# Patient Record
Sex: Female | Born: 1975 | Race: White | Hispanic: Yes | Marital: Married | Smoking: Never smoker
Health system: Southern US, Community
[De-identification: ages and names within clinical notes are randomized; demographics above are authoritative.]

## PROBLEM LIST (undated history)

## (undated) DIAGNOSIS — E282 Polycystic ovarian syndrome: Secondary | ICD-10-CM

## (undated) DIAGNOSIS — D573 Sickle-cell trait: Secondary | ICD-10-CM

## (undated) DIAGNOSIS — B977 Papillomavirus as the cause of diseases classified elsewhere: Secondary | ICD-10-CM

## (undated) HISTORY — DX: Papillomavirus as the cause of diseases classified elsewhere: B97.7

## (undated) HISTORY — DX: Polycystic ovarian syndrome: E28.2

## (undated) HISTORY — PX: HIP SURGERY: SHX245

---

## 1997-07-19 HISTORY — PX: CERVICAL BIOPSY  W/ LOOP ELECTRODE EXCISION: SUR135

## 2015-06-27 ENCOUNTER — Encounter: Payer: Self-pay | Admitting: Emergency Medicine

## 2015-06-27 ENCOUNTER — Ambulatory Visit
Admission: EM | Admit: 2015-06-27 | Discharge: 2015-06-27 | Disposition: A | Discharge status: Home / Self Care | Payer: BLUE CROSS/BLUE SHIELD | Attending: Family Medicine | Admitting: Family Medicine

## 2015-06-27 DIAGNOSIS — J069 Acute upper respiratory infection, unspecified: Secondary | ICD-10-CM | POA: Diagnosis not present

## 2015-06-27 DIAGNOSIS — M533 Sacrococcygeal disorders, not elsewhere classified: Secondary | ICD-10-CM

## 2015-06-27 HISTORY — DX: Sickle-cell trait: D57.3

## 2015-06-27 LAB — PREGNANCY, URINE: Preg Test, Ur: NEGATIVE

## 2015-06-27 LAB — RAPID STREP SCREEN (MED CTR MEBANE ONLY): Streptococcus, Group A Screen (Direct): NEGATIVE

## 2015-06-27 MED ORDER — FLUTICASONE PROPIONATE 50 MCG/ACT NA SUSP: 2.0000 | Freq: Every day | NASAL | Status: DC

## 2015-06-27 MED ORDER — MELOXICAM 15 MG PO TABS: 15.0000 mg | ORAL_TABLET | Freq: Every day | ORAL | Status: DC

## 2015-06-27 MED ORDER — METAXALONE 800 MG PO TABS
800.0000 mg | ORAL_TABLET | Freq: Three times a day (TID) | ORAL | Status: DC
Start: 2015-06-27 — End: 2016-03-18

## 2015-06-27 NOTE — ED Notes (Signed)
Patient c/o sore throat for a week.  Patient c/o lower back pain that started for couple of days.

## 2015-06-27 NOTE — Discharge Instructions (Signed)
Cool Mist Vaporizers Vaporizers may help relieve the symptoms of a cough and cold. They add moisture to the air, which helps mucus to become thinner and less sticky. This makes it easier to breathe and cough up secretions. Cool mist vaporizers do not cause serious burns like hot mist vaporizers, which may also be called steamers or humidifiers. Vaporizers have not been proven to help with colds. You should not use a vaporizer if you are allergic to mold. HOME CARE INSTRUCTIONS  Follow the package instructions for the vaporizer.  Do not use anything other than distilled water in the vaporizer.  Do not run the vaporizer all of the time. This can cause mold or bacteria to grow in the vaporizer.  Clean the vaporizer after each time it is used.  Clean and dry the vaporizer well before storing it.  Stop using the vaporizer if worsening respiratory symptoms develop.   This information is not intended to replace advice given to you by your health care provider. Make sure you discuss any questions you have with your health care provider.   Document Released: 04/01/2004 Document Revised: 07/10/2013 Document Reviewed: 11/22/2012 Elsevier Interactive Patient Education 2016 [employer]  Sacroiliac Joint Dysfunction Sacroiliac joint dysfunction is a condition that causes inflammation on one or both sides of the sacroiliac (SI) joint. The SI joint connects the lower part of the spine (sacrum) with the two upper portions of the pelvis (ilium). This condition causes deep aching or burning pain in the low back. In some cases, the pain may also spread into one or both buttocks or hips or spread down the legs. CAUSES This condition may be caused by:  Pregnancy. During pregnancy, extra stress is put on the SI joints because the pelvis widens.  Injury, such as:  Car accidents.  Sport-related injuries.  Work-related injuries.  Having one leg that is shorter than the other.  Conditions that affect  the joints, such as:  Rheumatoid arthritis.  Gout.  Psoriatic arthritis.  Joint infection (septic arthritis). Sometimes, the cause of SI joint dysfunction is not known. SYMPTOMS Symptoms of this condition include:  Aching or burning pain in the lower back. The pain may also spread to other areas, such as:  Buttocks.  Groin.  Thighs and legs.  Muscle spasms in or around the painful areas.  Increased pain when standing, walking, running, stair climbing, bending, or lifting. DIAGNOSIS Your health care provider will do a physical exam and take your medical history. During the exam, the health care provider may move one or both of your legs to different positions to check for pain. Various tests may be done to help verify the diagnosis, including:  Imaging tests to look for other causes of pain. These may include:  MRI.  CT scan.  Bone scan.  Diagnostic injection. A numbing medicine is injected into the SI joint using a needle. If the pain is temporarily improved or stopped after the injection, this can indicate that SI joint dysfunction is the problem. TREATMENT Treatment may vary depending on the cause and severity of your condition. Treatment options may include:  Applying ice or heat to the lower back area. This can help to reduce pain and muscle spasms.  Medicines to relieve pain or inflammation or to relax the muscles.  Wearing a back brace (sacroiliac brace) to help support the joint while your back is healing.  Physical therapy to increase muscle strength around the joint and flexibility at the joint. This may also involve learning  proper body positions and ways of moving to relieve stress on the joint.  Direct manipulation of the SI joint.  Injections of steroid medicine into the joint in order to reduce pain and swelling.  Radiofrequency ablation to burn away nerves that are carrying pain messages from the joint.  Use of a device that provides electrical  stimulation in order to reduce pain at the joint.  Surgery to put in screws and plates that limit or prevent joint motion. This is rare. HOME CARE INSTRUCTIONS  Rest as needed. Limit your activities as directed by your health care provider.  Take medicines only as directed by your health care provider.  If directed, apply ice to the affected area:  Put ice in a plastic bag.  Place a towel between your skin and the bag.  Leave the ice on for 20 minutes, 2-3 times per day.  Use a heating pad or a moist heat pack as directed by your health care provider.  Exercise as directed by your health care provider or physical therapist.  Keep all follow-up visits as directed by your health care provider. This is important. SEEK MEDICAL CARE IF:  Your pain is not controlled with medicine.  You have a fever.  You have increasingly severe pain. SEEK IMMEDIATE MEDICAL CARE IF:  You have weakness, numbness, or tingling in your legs or feet.  You lose control of your bladder or bowel.   This information is not intended to replace advice given to you by your health care provider. Make sure you discuss any questions you have with your health care provider.   Document Released: 10/01/2008 Document Revised: 11/19/2014 Document Reviewed: 03/12/2014 Elsevier Interactive Patient Education [employer]

## 2015-06-27 NOTE — ED Provider Notes (Signed)
CSN: [id number]     Arrival date & time 06/27/15  1215 History   First MD Initiated Contact with Patient 06/27/15 1357     Chief Complaint  Patient presents with  . Cough  . Back Pain   (Consider location/radiation/quality/duration/timing/severity/associated sxs/prior Treatment) HPI   This a 39 year old female who presents with complaints in  2 different areas.  First complaint is that of a sore throat for a week. States since she's had cold chills and a sore throat. She's felt feverish her temperature. She has had some sinus congestion along with a sore throat.  Second complaint is that of low back pain that started a couple days ago that is more right-sided and radiates into her right sacroiliac joint by indication. She's had recurrent low back pain that is usually signified by a bruise that she develops on her back. 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 Diarrhea remember any specific injury that occurred at work and has just noticed the pain over the last couple of days. Not radiate into her leg other than the sacroiliac joint level. She has no numbness or tingling. She's had no incontinence.  Past Medical History  Diagnosis Date  . Sickle cell trait [facility](HCC)    Past Surgical History  Procedure Laterality Date  . Hip surgery Right    History reviewed. No pertinent family history. Social History  Substance Use Topics  . Smoking status: Never Smoker   . Smokeless tobacco: None  . Alcohol Use: Yes   OB History    No data available     Review of Systems  Constitutional: Positive for fever, chills and activity change. Negative for fatigue.  HENT: Positive for congestion, postnasal drip and sore throat.   All other systems reviewed and are negative.   Allergies  Iodine  Home Medications   Prior to Admission medications   Medication Sig Start Date End Date Taking? Authorizing Provider   fluticasone (FLONASE) 50 MCG/ACT nasal spray Place 2 sprays into both nostrils daily. 06/27/15   Lutricia FeilWilliam P Bonnie Roig, PA-C  meloxicam (MOBIC) 15 MG tablet Take 1 tablet (15 mg total) by mouth daily. 06/27/15   Lutricia FeilWilliam P Jameire Kouba, PA-C  metaxalone (SKELAXIN) 800 MG tablet Take 1 tablet (800 mg total) by mouth 3 (three) times daily. 06/27/15   Lutricia FeilWilliam P Jamine Highfill, PA-C   Meds Ordered and Administered this Visit  Medications - No data to display  BP 135/80 mmHg  Pulse 92  Temp(Src) 97 F (36.1 C) (Tympanic)  Resp 16  Ht 4\' 11"  (1.499 m)  Wt 180 lb (81.647 kg)  BMI 36.34 kg/m2  SpO2 100%  LMP 05/29/2015 (Exact Date) No data found.   Physical Exam  Constitutional: She is oriented to person, place, and time. She appears well-developed and well-nourished. No distress.  HENT:  Head: Normocephalic and atraumatic.  Right Ear: External ear normal.  Left Ear: External ear normal.  Nose: Nose normal.  Mouth/Throat: Oropharynx is clear and moist. No oropharyngeal exudate.  Examination of the oropharynx shows the left tonsil to be slightly enlarged over the right. His mild erythema present. But no exudate is seen.  Eyes: Conjunctivae and EOM are normal. Pupils are equal, round, and reactive to light.  Neck: Normal range of motion. Neck supple.  Pulmonary/Chest: Breath sounds normal. No stridor. No respiratory distress. She has no wheezes. She has no rales.  Musculoskeletal: Normal range of motion. She  exhibits tenderness. She exhibits no edema.  Initial lumbar spine was carried out with the [facility] chaperone exams the lumbar spine shows a mild ecchymosis over the lower right spine. Forward flexion and bilateral lateral flexion is full and apparently comfortable. Is tenderness to palpation sharp and localized over the right sacral leg joint which is where the patient's symptoms are. Able to this toe and heel walk adequately. EHL peroneal and anterior tibialis tendons are drawn. Straight leg raise testing  is negative at 90 bilaterally in the sitting position.  Lymphadenopathy:    She has no cervical adenopathy.  Neurological: She is alert and oriented to person, place, and time.  Skin: Skin is warm and dry. No rash noted. She is not diaphoretic. No erythema.  Psychiatric: She has a normal mood and affect. Her behavior is normal. Judgment and thought content normal.  Nursing note and vitals reviewed.   ED Course  Procedures (including critical care time)  Labs Review Labs Reviewed  RAPID STREP SCREEN (NOT AT [facility])  CULTURE, GROUP A STREP ([facility] ONLY)  PREGNANCY, URINE    Imaging Review No results found.   Visual Acuity Review  Right Eye Distance:   Left Eye Distance:   Bilateral Distance:    Right Eye Near:   Left Eye Near:    Bilateral Near:         MDM   1. URI, acute   2. Pain of right sacroiliac joint    New Prescriptions   FLUTICASONE (FLONASE) 50 MCG/ACT NASAL SPRAY    Place 2 sprays into both nostrils daily.   MELOXICAM (MOBIC) 15 MG TABLET    Take 1 tablet (15 mg total) by mouth daily.   METAXALONE (SKELAXIN) 800 MG TABLET    Take 1 tablet (800 mg total) by mouth 3 (three) times daily.  Plan: 1. Test/x-ray results and diagnosis reviewed with patient 2. rx as per orders; risks, benefits, potential side effects reviewed with patient 3. Recommend supportive treatment with fluids and rest. Advised her to avoid sitting lifting and bending is much as feasible. Pregnancy test was negative today but she states that they are not trying to avoid pregnancy and therefore asked her to use the minimal amount of meloxicam until she feels somewhat better and then discontinue at use. 4. F/u prn if symptoms worsen or don't improve     Lutricia Feil, PA-C 06/27/15 1510

## 2015-06-30 LAB — CULTURE, GROUP A STREP (THRC)

## 2015-07-07 ENCOUNTER — Ambulatory Visit: Payer: Self-pay | Admitting: Family Medicine

## 2015-08-06 ENCOUNTER — Ambulatory Visit (INDEPENDENT_AMBULATORY_CARE_PROVIDER_SITE_OTHER): Payer: BLUE CROSS/BLUE SHIELD | Admitting: Internal Medicine

## 2015-08-06 ENCOUNTER — Encounter: Payer: Self-pay | Admitting: Internal Medicine

## 2015-08-06 VITALS — BP 98/62 | HR 72 | Ht 59.75 in | Wt 186.0 lb

## 2015-08-06 DIAGNOSIS — A630 Anogenital (venereal) warts: Secondary | ICD-10-CM

## 2015-08-06 DIAGNOSIS — D573 Sickle-cell trait: Secondary | ICD-10-CM | POA: Diagnosis not present

## 2015-08-06 DIAGNOSIS — M25559 Pain in unspecified hip: Secondary | ICD-10-CM

## 2015-08-06 DIAGNOSIS — N926 Irregular menstruation, unspecified: Secondary | ICD-10-CM

## 2015-08-06 DIAGNOSIS — B977 Papillomavirus as the cause of diseases classified elsewhere: Secondary | ICD-10-CM

## 2015-08-06 DIAGNOSIS — G8929 Other chronic pain: Secondary | ICD-10-CM | POA: Diagnosis not present
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
## 2015-08-07 ENCOUNTER — Telehealth: Payer: Self-pay

## 2015-08-07 LAB — HCG, SERUM, QUALITATIVE: HCG, BETA SUBUNIT, QUAL, SERUM: NEGATIVE m[IU]/mL (ref <6)

## 2015-08-07 NOTE — Telephone Encounter (Signed)
-----   Message from Reubin Milan, MD sent at 08/07/2015  7:59 AM EST ----- Negative pregnancy test.  Return for CPX and Pap - if persistent irregular menses, will refer for [place].

## 2015-08-07 NOTE — Telephone Encounter (Signed)
Left message for patient to call back  

## 2015-08-08 NOTE — Telephone Encounter (Signed)
Left message for patient to call back  

## 2015-08-11 NOTE — Telephone Encounter (Signed)
Spoke with patient. Patient advised of all results and verbalized understanding. Will call back with any future questions or concerns.   

## 2015-09-15 ENCOUNTER — Ambulatory Visit
Admission: EM | Admit: 2015-09-15 | Discharge: 2015-09-15 | Disposition: A | Discharge status: Home / Self Care | Payer: BLUE CROSS/BLUE SHIELD | Attending: Family Medicine | Admitting: Family Medicine

## 2015-09-15 ENCOUNTER — Encounter: Payer: Self-pay | Admitting: Emergency Medicine

## 2015-09-15 DIAGNOSIS — J069 Acute upper respiratory infection, unspecified: Secondary | ICD-10-CM

## 2015-09-15 LAB — RAPID INFLUENZA A&B ANTIGENS (ARMC ONLY)
INFLUENZA A (ARMC): NOT DETECTED — AB
INFLUENZA B (ARMC): NOT DETECTED — AB

## 2015-09-15 LAB — RAPID STREP SCREEN (MED CTR MEBANE ONLY): Streptococcus, Group A Screen (Direct): NEGATIVE

## 2015-09-15 MED ORDER — HYDROCOD POLST-CPM POLST ER 10-8 MG/5ML PO SUER: 5.0000 mL | Freq: Two times a day (BID) | ORAL | Status: DC

## 2015-09-15 NOTE — ED Provider Notes (Signed)
CSN: [id number]     Arrival date & time 09/15/15  1654 History   First MD Initiated Contact with Patient 09/15/15 1914     Chief Complaint  Patient presents with  . Fever  . Sore Throat  . Generalized Body Aches   (Consider location/radiation/quality/duration/timing/severity/associated sxs/prior Treatment) HPI   40 year old female who presents with body aches fever and a sore throat is started days ago. She's had a sick child that was examined here diagnosed with a viral illness and then now she has contracted. Her daughter had gotten the flu type symptoms from her cousin they play together all the time. She states that her right ear has been bothering her as well. She started coughing today which has been nonproductive so far. She did not receive a flu shot this year. Sates that her sore throat has been very sore as well.  Past Medical History  Diagnosis Date  . Sickle cell trait (HCC)   . PCOS (polycystic ovarian syndrome)   . HPV in female    Past Surgical History  Procedure Laterality Date  . Hip surgery Right     39mo old  . Cervical biopsy  w/ loop electrode excision  1999    abnormal Pap   Family History  Problem Relation Age of Onset  . Diabetes Father   . Sickle cell trait Father    Social History  Substance Use Topics  . Smoking status: Never Smoker   . Smokeless tobacco: None  . Alcohol Use: 0.0 oz/week    0 Standard drinks or equivalent per week     Comment: occasional   OB History    No data available     Review of Systems  Constitutional: Positive for fever, chills and fatigue. Negative for activity change.  HENT: Positive for congestion, postnasal drip, rhinorrhea, sinus pressure, sneezing and sore throat.   Respiratory: Positive for cough and shortness of breath. Negative for wheezing and stridor.   All other systems reviewed and are negative.   Allergies  Iodinated diagnostic agents and Iodine  Home Medications   Prior to Admission medications    Medication Sig Start Date End Date Taking? Authorizing Provider  chlorpheniramine-HYDROcodone (TUSSIONEX PENNKINETIC ER) 10-8 MG/5ML SUER Take 5 mLs by mouth 2 (two) times daily. 09/15/15   Lutricia Feil, PA-C  Cyanocobalamin (RA VITAMIN B-12 TR) 1000 MCG TBCR Take 1 tablet by mouth daily.    Historical Provider, MD  fluticasone (FLONASE) 50 MCG/ACT nasal spray Place 2 sprays into both nostrils daily. 06/27/15   Lutricia Feil, PA-C  Ginkgo Biloba 40 MG TABS Take 1 tablet by mouth daily.    Historical Provider, MD  meloxicam (MOBIC) 15 MG tablet Take 1 tablet (15 mg total) by mouth daily. 06/27/15   Lutricia Feil, PA-C  metaxalone (SKELAXIN) 800 MG tablet Take 1 tablet (800 mg total) by mouth 3 (three) times daily. 06/27/15   Lutricia Feil, PA-C  Multiple Vitamin (MULTI-VITAMINS) TABS Take 1 tablet by mouth daily.    Historical Provider, MD   Meds Ordered and Administered this Visit  Medications - No data to display  BP 112/72 mmHg  Pulse 102  Temp(Src) 99.7 F (37.6 C) (Tympanic)  Resp 16  Ht  (1.499 m)  Wt 182 lb (82.555 kg)  BMI 36.74 kg/m2  SpO2 100%  LMP 09/05/2015 (Exact Date) No data found.   Physical Exam  Constitutional: She is oriented to person, place, and time. She appears well-developed and well-nourished.  No distress.  HENT:  Head: Normocephalic and atraumatic.  Right Ear: External ear normal.  Left Ear: External ear normal.  Nose: Nose normal.  Mouth/Throat: Oropharynx is clear and moist. No oropharyngeal exudate.  Eyes: Conjunctivae are normal. Pupils are equal, round, and reactive to light. Right eye exhibits no discharge. Left eye exhibits no discharge.  Neck: Normal range of motion. Neck supple.  Pulmonary/Chest: Effort normal and breath sounds normal. No respiratory distress. She has no wheezes. She has no rales.  Musculoskeletal: Normal range of motion. She exhibits no edema or tenderness.  Neurological: She is alert and oriented to person,  place, and time.  Skin: Skin is warm and dry. She is not diaphoretic.  Psychiatric: She has a normal mood and affect. Her behavior is normal. Judgment and thought content normal.  Nursing note and vitals reviewed.   ED Course  Procedures (including critical care time)  Labs Review Labs Reviewed  RAPID INFLUENZA A&B ANTIGENS ([facility] ONLY) - Abnormal; Notable for the following:    Influenza A [facility]) NOT DETECTED (*)    Influenza B ([facility]) NOT DETECTED (*)    All other components within normal limits  RAPID STREP SCREEN (NOT AT [facility])  CULTURE, GROUP A STREP [facility])    Imaging Review No results found.   Visual Acuity Review  Right Eye Distance:   Left Eye Distance:   Bilateral Distance:    Right Eye Near:   Left Eye Near:    Bilateral Near:         MDM   1. Acute URI    New Prescriptions   CHLORPHENIRAMINE-HYDROCODONE (TUSSIONEX PENNKINETIC ER) 10-8 MG/5ML SUER    Take 5 mLs by mouth 2 (two) times daily.  Plan: 1. Test/x-ray results and diagnosis reviewed with patient 2. rx as per orders; risks, benefits, potential side effects reviewed with patient 3. Recommend supportive treatment with rest and fluids. Given her a note for work for 2 days leaving today. Follow-up with primary care she has any in increase in symptoms or is not improving 4. F/u prn if symptoms worsen or don't improve     Lutricia Feil, PA-C 09/15/15 1944

## 2015-09-15 NOTE — Discharge Instructions (Signed)
Cool Mist Vaporizers °Vaporizers may help relieve the symptoms of a cough and cold. They add moisture to the air, which helps mucus to become thinner and less sticky. This makes it easier to breathe and cough up secretions. Cool mist vaporizers do not cause serious burns like hot mist vaporizers, which may also be called steamers or humidifiers. Vaporizers have not been proven to help with colds. You should not use a vaporizer if you are allergic to mold. °HOME CARE INSTRUCTIONS °· Follow the package instructions for the vaporizer. °· Do not use anything other than distilled water in the vaporizer. °· Do not run the vaporizer all of the time. This can cause mold or bacteria to grow in the vaporizer. °· Clean the vaporizer after each time it is used. °· Clean and dry the vaporizer well before storing it. °· Stop using the vaporizer if worsening respiratory symptoms develop. °  °This information is not intended to replace advice given to you by your health care provider. Make sure you discuss any questions you have with your health care provider. °  °Document Released: 04/01/2004 Document Revised: 07/10/2013 Document Reviewed: 11/22/2012 °Elsevier Interactive Patient Education ©2016 Elsevier Inc[employer] ° °Upper Respiratory Infection, Adult °Most upper respiratory infections (URIs) are a viral infection of the air passages leading to the lungs. A URI affects the nose, throat, and upper air passages. The most common type of URI is nasopharyngitis and is typically referred to as "the common cold." °URIs run their course and usually go away on their own. Most of the time, a URI does not require medical attention, but sometimes a bacterial infection in the upper airways can follow a viral infection. This is called a secondary infection. Sinus and middle ear infections are common types of secondary upper respiratory infections. °Bacterial pneumonia can also complicate a URI. A URI can worsen asthma and chronic obstructive  pulmonary disease (COPD). Sometimes, these complications can require emergency medical care and may be life threatening.  °CAUSES °Almost all URIs are caused by viruses. A virus is a type of germ and can spread from one person to another.  °RISKS FACTORS °You may be at risk for a URI if:  °· You smoke.   °· You have chronic heart or lung disease. °· You have a weakened defense (immune) system.   °· You are very young or very old.   °· You have nasal allergies or asthma. °· You work in crowded or poorly ventilated areas. °· You work in health care facilities or schools. °SIGNS AND SYMPTOMS  °Symptoms typically develop 2-3 days after you come in contact with a cold virus. Most viral URIs last 7-10 days. However, viral URIs from the influenza virus (flu virus) can last 14-18 days and are typically more severe. Symptoms may include:  °· Runny or stuffy (congested) nose.   °· Sneezing.   °· Cough.   °· Sore throat.   °· Headache.   °· Fatigue.   °· Fever.   °· Loss of appetite.   °· Pain in your forehead, behind your eyes, and over your cheekbones (sinus pain). °· Muscle aches.   °DIAGNOSIS  °Your health care provider may diagnose a URI by: °· Physical exam. °· Tests to check that your symptoms are not due to another condition such as: °¨ Strep throat. °¨ Sinusitis. °¨ Pneumonia. °¨ Asthma. °TREATMENT  °A URI goes away on its own with time. It cannot be cured with medicines, but medicines may be prescribed or recommended to relieve symptoms. Medicines may help: °· Reduce your fever. °· Reduce   your cough. °· Relieve nasal congestion. °HOME CARE INSTRUCTIONS  °· Take medicines only as directed by your health care provider.   °· Gargle warm saltwater or take cough drops to comfort your throat as directed by your health care provider. °· Use a warm mist humidifier or inhale steam from a shower to increase air moisture. This may make it easier to breathe. °· Drink enough fluid to keep your urine clear or pale yellow.   °· Eat  soups and other clear broths and maintain good nutrition.   °· Rest as needed.   °· Return to work when your temperature has returned to normal or as your health care provider advises. You may need to stay home longer to avoid infecting others. You can also use a face mask and careful hand washing to prevent spread of the virus. °· Increase the usage of your inhaler if you have asthma.   °· Do not use any tobacco products, including cigarettes, chewing tobacco, or electronic cigarettes. If you need help quitting, ask your health care provider. °PREVENTION  °The best way to protect yourself from getting a cold is to practice good hygiene.  °· Avoid oral or hand contact with people with cold symptoms.   °· Wash your hands often if contact occurs.   °There is no clear evidence that vitamin C, vitamin E, echinacea, or exercise reduces the chance of developing a cold. However, it is always recommended to get plenty of rest, exercise, and practice good nutrition.  °SEEK MEDICAL CARE IF:  °· You are getting worse rather than better.   °· Your symptoms are not controlled by medicine.   °· You have chills. °· You have worsening shortness of breath. °· You have brown or red mucus. °· You have yellow or brown nasal discharge. °· You have pain in your face, especially when you bend forward. °· You have a fever. °· You have swollen neck glands. °· You have pain while swallowing. °· You have white areas in the back of your throat. °SEEK IMMEDIATE MEDICAL CARE IF:  °· You have severe or persistent: °¨ Headache. °¨ Ear pain. °¨ Sinus pain. °¨ Chest pain. °· You have chronic lung disease and any of the following: °¨ Wheezing. °¨ Prolonged cough. °¨ Coughing up blood. °¨ A change in your usual mucus. °· You have a stiff neck. °· You have changes in your: °¨ Vision. °¨ Hearing. °¨ Thinking. °¨ Mood. °MAKE SURE YOU:  °· Understand these instructions. °· Will watch your condition. °· Will get help right away if you are not doing well or  get worse. °  °This information is not intended to replace advice given to you by your health care provider. Make sure you discuss any questions you have with your health care provider. °  °Document Released: 12/29/2000 Document Revised: 11/19/2014 Document Reviewed: 10/10/2013 °Elsevier Interactive Patient Education ©2016 Elsevier Inc. ° °

## 2015-09-15 NOTE — ED Notes (Signed)
Patient c/o bodyaches, fever, and sore throat that started on Sunday.

## 2015-09-17 LAB — CULTURE, GROUP A STREP (THRC)

## 2015-11-05 ENCOUNTER — Ambulatory Visit (INDEPENDENT_AMBULATORY_CARE_PROVIDER_SITE_OTHER): Payer: BLUE CROSS/BLUE SHIELD | Admitting: Internal Medicine

## 2015-11-05 ENCOUNTER — Encounter: Payer: Self-pay | Admitting: Internal Medicine

## 2015-11-05 VITALS — BP 108/70 | HR 68 | Ht 59.0 in | Wt 184.0 lb

## 2015-11-05 DIAGNOSIS — Z0000 Encounter for general adult medical examination without abnormal findings: Secondary | ICD-10-CM | POA: Diagnosis not present

## 2015-11-05 DIAGNOSIS — Z1239 Encounter for other screening for malignant neoplasm of breast: Secondary | ICD-10-CM

## 2015-11-05 DIAGNOSIS — Z8742 Personal history of other diseases of the female genital tract: Secondary | ICD-10-CM

## 2015-11-05 DIAGNOSIS — Z91013 Allergy to seafood: Secondary | ICD-10-CM | POA: Diagnosis not present

## 2015-11-05 LAB — POCT URINALYSIS DIPSTICK
BILIRUBIN UA: NEGATIVE
Blood, UA: NEGATIVE
GLUCOSE UA: NEGATIVE
KETONES UA: NEGATIVE
Leukocytes, UA: NEGATIVE
Nitrite, UA: NEGATIVE
PH UA: 6.5
Protein, UA: NEGATIVE
Spec Grav, UA: <=1.005
UROBILINOGEN UA: 0.2

## 2015-11-05 MED ORDER — EPINEPHRINE 0.3 MG/0.3ML IJ SOAJ: 0.3000 mg | Freq: Once | INTRAMUSCULAR | Status: AC

## 2015-11-05 NOTE — Patient Instructions (Signed)
Breast Self-Awareness Practicing breast self-awareness may pick up problems early, prevent significant medical complications, and possibly save your life. By practicing breast self-awareness, you can become familiar with how your breasts look and feel and if your breasts are changing. This allows you to notice changes early. It can also offer you some reassurance that your breast health is good. One way to learn what is normal for your breasts and whether your breasts are changing is to do a breast self-exam. If you find a lump or something that was not present in the past, it is best to contact your caregiver right away. Other findings that should be evaluated by your caregiver include nipple discharge, especially if it is bloody; skin changes or reddening; areas where the skin seems to be pulled in (retracted); or new lumps and bumps. Breast pain is seldom associated with cancer (malignancy), but should also be evaluated by a caregiver. HOW TO PERFORM A BREAST SELF-EXAM The best time to examine your breasts is 5-7 days after your menstrual period is over. During menstruation, the breasts are lumpier, and it may be more difficult to pick up changes. If you do not menstruate, have reached menopause, or had your uterus removed (hysterectomy), you should examine your breasts at regular intervals, such as monthly. If you are breastfeeding, examine your breasts after a feeding or after using a breast pump. Breast implants do not decrease the risk for lumps or tumors, so continue to perform breast self-exams as recommended. Talk to your caregiver about how to determine the difference between the implant and breast tissue. Also, talk about the amount of pressure you should use during the exam. Over time, you will become more familiar with the variations of your breasts and more comfortable with the exam. A breast self-exam requires you to remove all your clothes above the waist. 1. Look at your breasts and nipples.  Stand in front of a mirror in a room with good lighting. With your hands on your hips, push your hands firmly downward. Look for a difference in shape, contour, and size from one breast to the other (asymmetry). Asymmetry includes puckers, dips, or bumps. Also, look for skin changes, such as reddened or scaly areas on the breasts. Look for nipple changes, such as discharge, dimpling, repositioning, or redness. 2. Carefully feel your breasts. This is best done either in the shower or tub while using soapy water or when flat on your back. Place the arm (on the side of the breast you are examining) above your head. Use the pads (not the fingertips) of your three middle fingers on your opposite hand to feel your breasts. Start in the underarm area and use  inch (2 cm) overlapping circles to feel your breast. Use 3 different levels of pressure (light, medium, and firm pressure) at each circle before moving to the next circle. The light pressure is needed to feel the tissue closest to the skin. The medium pressure will help to feel breast tissue a little deeper, while the firm pressure is needed to feel the tissue close to the ribs. Continue the overlapping circles, moving downward over the breast until you feel your ribs below your breast. Then, move one finger-width towards the center of the body. Continue to use the  inch (2 cm) overlapping circles to feel your breast as you move slowly up toward the collar bone (clavicle) near the base of the neck. Continue the up and down exam using all 3 pressures until you reach the   middle of the chest. Do this with each breast, carefully feeling for lumps or changes. 3.  Keep a written record with breast changes or normal findings for each breast. By writing this information down, you do not need to depend only on memory for size, tenderness, or location. Write down where you are in your menstrual cycle, if you are still menstruating. Breast tissue can have some lumps or  thick tissue. However, see your caregiver if you find anything that concerns you.  SEEK MEDICAL CARE IF:  You see a change in shape, contour, or size of your breasts or nipples.   You see skin changes, such as reddened or scaly areas on the breasts or nipples.   You have an unusual discharge from your nipples.   You feel a new lump or unusually thick areas.    This information is not intended to replace advice given to you by your health care provider. Make sure you discuss any questions you have with your health care provider.   Document Released: 07/05/2005 Document Revised: 06/21/2012 Document Reviewed: 10/20/2011 Elsevier Interactive Patient Education 2016 Elsevier Inc.  

## 2015-11-05 NOTE — Progress Notes (Signed)
Date:  11/05/2015   Name:  Monica MulderDaisy Robbins   DOB:  Aug 22, 1975   MRN:  [id number]   Chief Complaint: Annual Exam Monica ReilDaisy Gerhard Robbins is a 40 y.o. female who presents today for her Complete Annual Exam. She feels fairly well. She reports exercising some. She reports she is sleeping fairly well. She would like to have another child with her finance but she is getting older.  We discussed GYN consultation. She also had a severe reaction to shellfish years ago and was told never to eat any kind of fish.  She is wondering if she could be referred to an allergy specialist.    Review of Systems  Constitutional: Negative for fever, chills and fatigue.  HENT: Negative for congestion, hearing loss, tinnitus, trouble swallowing and voice change.   Eyes: Negative for visual disturbance.  Respiratory: Negative for cough, chest tightness, shortness of breath and wheezing.   Cardiovascular: Negative for chest pain, palpitations and leg swelling.  Gastrointestinal: Negative for vomiting, abdominal pain, diarrhea and constipation.  Endocrine: Negative for polydipsia and polyuria.  Genitourinary: Negative for dysuria, frequency, vaginal bleeding, vaginal discharge, genital sores and vaginal pain.  Musculoskeletal: Positive for myalgias (mild discomfort over spider veins on legs) and arthralgias. Negative for joint swelling and gait problem.  Skin: Negative for color change and rash.  Neurological: Negative for dizziness, tremors, light-headedness and headaches.  Hematological: Negative for adenopathy. Does not bruise/bleed easily.  Psychiatric/Behavioral: Negative for sleep disturbance and dysphoric mood. The patient is not nervous/anxious.     Patient Active Problem List   Diagnosis Date Noted  . Hip pain, chronic 08/06/2015  . Sickle cell trait (HCC) 08/06/2015  . Menstrual irregularity 08/06/2015    Prior to Admission medications   Medication Sig Start Date End Date Taking? Authorizing Provider    Cyanocobalamin (RA VITAMIN B-12 TR) 1000 MCG TBCR Take 1 tablet by mouth daily.   Yes Historical Provider, MD  fluticasone (FLONASE) 50 MCG/ACT nasal spray Place 2 sprays into both nostrils daily. 06/27/15  Yes Lutricia FeilWilliam P Roemer, PA-C  GARCINIA CAMBOGIA-CHROMIUM PO Take by mouth.   Yes Historical Provider, MD  Ginkgo Biloba 40 MG TABS Take 1 tablet by mouth daily.   Yes Historical Provider, MD  magnesium oxide (MAG-OX) 400 MG tablet Take 400 mg by mouth daily.   Yes Historical Provider, MD  metaxalone (SKELAXIN) 800 MG tablet Take 1 tablet (800 mg total) by mouth 3 (three) times daily. 06/27/15  Yes Lutricia FeilWilliam P Roemer, PA-C  Multiple Vitamin (MULTI-VITAMINS) TABS Take 1 tablet by mouth daily.   Yes Historical Provider, MD  meloxicam (MOBIC) 15 MG tablet Take 1 tablet (15 mg total) by mouth daily. Patient not taking: Reported on 11/05/2015 06/27/15   Lutricia FeilWilliam P Roemer, PA-C    Allergies  Allergen Reactions  . Iodinated Diagnostic Agents Anaphylaxis  . Iodine Anaphylaxis    Past Surgical History  Procedure Laterality Date  . Hip surgery Right     71mo old  . Cervical biopsy  w/ loop electrode excision  1999    abnormal Pap    Social History  Substance Use Topics  . Smoking status: Never Smoker   . Smokeless tobacco: None  . Alcohol Use: 0.0 oz/week    0 Standard drinks or equivalent per week     Comment: occasional     Medication list has been reviewed and updated.   Physical Exam  Constitutional: She is oriented to person, place, and time. She appears well-developed and well-nourished.  No distress.  HENT:  Head: Normocephalic and atraumatic.  Right Ear: Tympanic membrane and ear canal normal.  Left Ear: Tympanic membrane and ear canal normal.  Nose: Right sinus exhibits no maxillary sinus tenderness. Left sinus exhibits no maxillary sinus tenderness.  Mouth/Throat: Uvula is midline and oropharynx is clear and moist.  Eyes: Conjunctivae and EOM are normal. Right eye exhibits no  discharge. Left eye exhibits no discharge. No scleral icterus.  Neck: Normal range of motion. Carotid bruit is not present. No erythema present. No thyromegaly present.  Cardiovascular: Normal rate, regular rhythm, normal heart sounds and normal pulses.   Pulmonary/Chest: Effort normal. No respiratory distress. She has no wheezes. Right breast exhibits no mass, no nipple discharge, no skin change and no tenderness. Left breast exhibits no mass, no nipple discharge, no skin change and no tenderness.  Abdominal: Soft. Bowel sounds are normal. There is no hepatosplenomegaly. There is no tenderness. There is no CVA tenderness.  Genitourinary: Rectum normal, vagina normal and uterus normal. No breast swelling, tenderness, discharge or bleeding. There is no rash, tenderness or lesion on the right labia. There is no rash, tenderness or lesion on the left labia. Cervix exhibits no motion tenderness, no discharge and no friability. Right adnexum displays no mass, no tenderness and no fullness. Left adnexum displays no mass, no tenderness and no fullness.  Lymphadenopathy:    She has no cervical adenopathy.    She has no axillary adenopathy.  Neurological: She is alert and oriented to person, place, and time. She has normal reflexes. No cranial nerve deficit or sensory deficit.  Skin: Skin is warm, dry and intact. No rash noted.  Psychiatric: She has a normal mood and affect. Her speech is normal and behavior is normal. Thought content normal.  Nursing note and vitals reviewed.   BP 108/70 mmHg  Pulse 68  Ht  (1.499 m)  Wt 184 lb (83.462 kg)  BMI 37.14 kg/m2  LMP 10/02/2015  Assessment and Plan: 1. Annual physical exam Normal exam except for weight Recommend regular exercise and continue low carb diet Consider GYN consultation regarding desire for pregnancy - CBC with Differential/Platelet - Comprehensive metabolic panel - Lipid panel - TSH - POCT urinalysis dipstick  2. Shellfish  allergy - Ambulatory referral to ENT - EPINEPHrine 0.3 mg/0.3 mL IJ SOAJ injection; Inject 0.3 mLs (0.3 mg total) into the muscle once.  Dispense: 2 Device; Refill: 1  3. Hx of abnormal cervical Papanicolaou smear Repeated today - Pap IG and HPV (high risk) DNA detection  4. Breast cancer screening - MM DIGITAL SCREENING BILATERAL; Future   Bari Edward, MD [facility]  11/05/2015

## 2015-11-06 LAB — COMPREHENSIVE METABOLIC PANEL
A/G RATIO: 1.5 (ref 1.2–2.2)
ALT: 10 IU/L (ref 0–32)
AST: 19 IU/L (ref 0–40)
Albumin: 4.1 g/dL (ref 3.5–5.5)
Alkaline Phosphatase: 80 IU/L (ref 39–117)
BUN/Creatinine Ratio: 15 (ref 9–23)
BUN: 10 mg/dL (ref 6–24)
Bilirubin Total: 0.3 mg/dL (ref 0.0–1.2)
CALCIUM: 9.2 mg/dL (ref 8.7–10.2)
CO2: 18 mmol/L (ref 18–29)
Chloride: 102 mmol/L (ref 96–106)
Creatinine, Ser: 0.67 mg/dL (ref 0.57–1.00)
GFR, EST AFRICAN AMERICAN: 126 mL/min/{1.73_m2} (ref >59)
GFR, EST NON AFRICAN AMERICAN: 110 mL/min/{1.73_m2} (ref >59)
GLOBULIN, TOTAL: 2.8 g/dL (ref 1.5–4.5)
Glucose: 91 mg/dL (ref 65–99)
POTASSIUM: 4.9 mmol/L (ref 3.5–5.2)
SODIUM: 143 mmol/L (ref 134–144)
TOTAL PROTEIN: 6.9 g/dL (ref 6.0–8.5)

## 2015-11-06 LAB — CBC WITH DIFFERENTIAL/PLATELET
BASOS: 0 %
Basophils Absolute: 0 10*3/uL (ref 0.0–0.2)
EOS (ABSOLUTE): 0.3 10*3/uL (ref 0.0–0.4)
EOS: 3 %
HEMATOCRIT: 43.7 % (ref 34.0–46.6)
Hemoglobin: 14.8 g/dL (ref 11.1–15.9)
IMMATURE GRANS (ABS): 0 10*3/uL (ref 0.0–0.1)
IMMATURE GRANULOCYTES: 0 %
Lymphocytes Absolute: 1.8 10*3/uL (ref 0.7–3.1)
Lymphs: 21 %
MCH: 29.4 pg (ref 26.6–33.0)
MCHC: 33.9 g/dL (ref 31.5–35.7)
MCV: 87 fL (ref 79–97)
MONOS ABS: 0.5 10*3/uL (ref 0.1–0.9)
Monocytes: 6 %
NEUTROS ABS: 5.8 10*3/uL (ref 1.4–7.0)
NEUTROS PCT: 70 %
Platelets: 247 10*3/uL (ref 150–379)
RBC: 5.03 x10E6/uL (ref 3.77–5.28)
RDW: 14.7 % (ref 12.3–15.4)
WBC: 8.4 10*3/uL (ref 3.4–10.8)

## 2015-11-06 LAB — LIPID PANEL
CHOL/HDL RATIO: 4.5 ratio — AB (ref 0.0–4.4)
Cholesterol, Total: 207 mg/dL — ABNORMAL HIGH (ref 100–199)
HDL: 46 mg/dL (ref >39)
LDL Calculated: 145 mg/dL — ABNORMAL HIGH (ref 0–99)
Triglycerides: 80 mg/dL (ref 0–149)
VLDL Cholesterol Cal: 16 mg/dL (ref 5–40)

## 2015-11-06 LAB — TSH: TSH: 0.871 u[IU]/mL (ref 0.450–4.500)

## 2015-11-10 LAB — PAP IG AND HPV HIGH-RISK
HPV, high-risk: NEGATIVE
PAP Smear Comment: 0

## 2015-11-11 ENCOUNTER — Telehealth: Payer: Self-pay

## 2015-11-11 NOTE — Telephone Encounter (Signed)
Left message for patient to call back  

## 2015-11-11 NOTE — Telephone Encounter (Signed)
-----   Message from Reubin MilanLaura H Berglund, MD sent at 11/11/2015  8:11 AM EDT ----- Labs are normal except for borderline elevated cholesterol - no medication is recommended at this time.  Pap is negative with negative HPV.

## 2015-11-13 ENCOUNTER — Ambulatory Visit
Admission: RE | Admit: 2015-11-13 | Discharge: 2015-11-13 | Disposition: A | Discharge status: Home / Self Care | Payer: BLUE CROSS/BLUE SHIELD | Source: Ambulatory Visit | Attending: Internal Medicine | Admitting: Internal Medicine

## 2015-11-13 DIAGNOSIS — Z1231 Encounter for screening mammogram for malignant neoplasm of breast: Secondary | ICD-10-CM | POA: Diagnosis present

## 2015-11-13 DIAGNOSIS — R928 Other abnormal and inconclusive findings on diagnostic imaging of breast: Secondary | ICD-10-CM | POA: Insufficient documentation

## 2015-11-13 DIAGNOSIS — Z1239 Encounter for other screening for malignant neoplasm of breast: Secondary | ICD-10-CM

## 2015-11-13 NOTE — Telephone Encounter (Signed)
Spoke with patient. Patient advised of all results and verbalized understanding. Will call back with any future questions or concerns. [facility]  

## 2015-11-18 ENCOUNTER — Other Ambulatory Visit: Payer: Self-pay | Admitting: Internal Medicine

## 2015-11-18 DIAGNOSIS — R928 Other abnormal and inconclusive findings on diagnostic imaging of breast: Secondary | ICD-10-CM

## 2015-11-21 ENCOUNTER — Ambulatory Visit
Admission: RE | Admit: 2015-11-21 | Discharge: 2015-11-21 | Disposition: A | Discharge status: Home / Self Care | Payer: BLUE CROSS/BLUE SHIELD | Source: Ambulatory Visit | Attending: Internal Medicine | Admitting: Internal Medicine

## 2015-11-21 DIAGNOSIS — R928 Other abnormal and inconclusive findings on diagnostic imaging of breast: Secondary | ICD-10-CM | POA: Diagnosis present

## 2015-11-21 DIAGNOSIS — N6489 Other specified disorders of breast: Secondary | ICD-10-CM | POA: Insufficient documentation

## 2016-03-18 ENCOUNTER — Other Ambulatory Visit: Payer: Self-pay | Admitting: Physician Assistant

## 2016-03-18 ENCOUNTER — Ambulatory Visit
Admission: RE | Admit: 2016-03-18 | Discharge: 2016-03-18 | Disposition: A | Discharge status: Home / Self Care | Payer: BLUE CROSS/BLUE SHIELD | Source: Ambulatory Visit | Attending: Physician Assistant | Admitting: Physician Assistant

## 2016-03-18 ENCOUNTER — Ambulatory Visit (INDEPENDENT_AMBULATORY_CARE_PROVIDER_SITE_OTHER): Payer: BLUE CROSS/BLUE SHIELD | Admitting: Internal Medicine

## 2016-03-18 ENCOUNTER — Encounter: Payer: Self-pay | Admitting: Internal Medicine

## 2016-03-18 VITALS — BP 122/82 | HR 84 | Resp 16 | Ht 59.0 in | Wt 184.0 lb

## 2016-03-18 DIAGNOSIS — M1611 Unilateral primary osteoarthritis, right hip: Secondary | ICD-10-CM | POA: Insufficient documentation

## 2016-03-18 DIAGNOSIS — M76891 Other specified enthesopathies of right lower limb, excluding foot: Secondary | ICD-10-CM

## 2016-03-18 DIAGNOSIS — Q6589 Other specified congenital deformities of hip: Secondary | ICD-10-CM

## 2016-03-18 DIAGNOSIS — M533 Sacrococcygeal disorders, not elsewhere classified: Secondary | ICD-10-CM

## 2016-03-18 DIAGNOSIS — A499 Bacterial infection, unspecified: Secondary | ICD-10-CM | POA: Diagnosis not present

## 2016-03-18 DIAGNOSIS — M7061 Trochanteric bursitis, right hip: Secondary | ICD-10-CM

## 2016-03-18 DIAGNOSIS — M25551 Pain in right hip: Secondary | ICD-10-CM

## 2016-03-18 DIAGNOSIS — G8929 Other chronic pain: Secondary | ICD-10-CM

## 2016-03-18 DIAGNOSIS — N760 Acute vaginitis: Secondary | ICD-10-CM | POA: Diagnosis not present

## 2016-03-18 DIAGNOSIS — B9689 Other specified bacterial agents as the cause of diseases classified elsewhere: Secondary | ICD-10-CM

## 2016-03-18 MED ORDER — METRONIDAZOLE 500 MG PO TABS: 500.0000 mg | ORAL_TABLET | Freq: Two times a day (BID) | ORAL | 0 refills | Status: DC

## 2016-03-18 NOTE — Progress Notes (Signed)
Date:  03/18/2016   Name:  Alan MulderDaisy Gingras   DOB:  [birth year]   MRN:  [id number]   Chief Complaint: Vaginitis Vaginal Itching  The patient's primary symptoms include a genital odor and vaginal discharge. This is a new problem. The current episode started in the past 7 days. The problem has been unchanged. The patient is experiencing no pain. Pertinent negatives include no chills, dysuria or fever. The vaginal discharge was scant and bloody (after rough intercourse). She is sexually active.   Hip dysplasia - [facility] hip. 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 She does not want to consider hip replacement at her age so has consulted a physician that does stem cell therapy. They have recommended that she obtain an MRI. They gave her an order for the MRI but she is not sure what to do with this. She did obtain cortisone injection in the right hip from the same practice with some benefit.  Review of Systems  Constitutional: Negative for chills, fatigue and fever.  Respiratory: Negative for chest tightness, shortness of breath and wheezing.   Cardiovascular: Negative for chest pain, palpitations and leg swelling.  Genitourinary: Positive for vaginal discharge. Negative for dysuria.  Musculoskeletal: Positive for arthralgias.    Patient Active Problem List   Diagnosis Date Noted  . Abnormal mammogram of right breast 11/21/2015  . Hip pain, chronic 08/06/2015  . Sickle cell trait (HCC) 08/06/2015  . Menstrual irregularity 08/06/2015    Prior to Admission medications   Medication Sig Start Date End Date Taking? Authorizing Provider  Ascorbic Acid (VITA-C PO) Take by mouth.   Yes Historical Provider, MD  CRANBERRY SOFT PO Take by mouth.   Yes Historical Provider, MD  Cyanocobalamin (RA VITAMIN B-12 TR) 1000 MCG TBCR Take 1 tablet by mouth daily.   Yes Historical Provider, MD  EPINEPHrine 0.3 mg/0.3 mL IJ SOAJ injection Inject 0.3 mLs (0.3 mg total) into  the muscle once. 11/05/15  Yes Reubin MilanLaura H Adriyana Greenbaum, MD  Ginkgo Biloba 40 MG TABS Take 1 tablet by mouth daily.   Yes Historical Provider, MD  Ginkgo Biloba 40 MG TABS Take by mouth.   Yes Historical Provider, MD  magnesium oxide (MAG-OX) 400 MG tablet Take 400 mg by mouth daily.   Yes Historical Provider, MD  Multiple Vitamin (MULTI-VITAMINS) TABS Take 1 tablet by mouth daily.   Yes Historical Provider, MD  pyridOXINE (B-6) 50 MG tablet Take 50 mg by mouth.   Yes Historical Provider, MD  fluticasone (FLONASE) 50 MCG/ACT nasal spray Place 2 sprays into both nostrils daily. Patient not taking: Reported on 03/18/2016 06/27/15   Lutricia FeilWilliam P Roemer, PA-C    Allergies  Allergen Reactions  . Iodinated Diagnostic Agents Anaphylaxis  . Iodine Anaphylaxis    Past Surgical History:  Procedure Laterality Date  . CERVICAL BIOPSY  W/ LOOP ELECTRODE EXCISION  1999   abnormal Pap  . HIP SURGERY Right    18mo old    Social History  Substance Use Topics  . Smoking status: Never Smoker  . Smokeless tobacco: Never Used  . Alcohol use 0.0 oz/week     Comment: occasional     Medication list has been reviewed and updated.   Physical Exam  Constitutional: She is oriented to person, place, and time. She appears well-developed. No distress.  HENT:  Head: Normocephalic and atraumatic.  Cardiovascular: Normal rate, regular rhythm and normal heart sounds.   Pulmonary/Chest:  Effort normal and breath sounds normal. No respiratory distress.  Abdominal: Soft. Bowel sounds are normal. There is no tenderness.  Neurological: She is alert and oriented to person, place, and time.  Skin: Skin is warm and dry. No rash noted.  Psychiatric: She has a normal mood and affect. Her behavior is normal. Thought content normal.  Nursing note and vitals reviewed.   BP 122/82   Pulse 84   Resp 16   Ht 4\' 11"  (1.499 m)   Wt 184 lb (83.5 kg)   BMI 37.16 kg/m   Assessment and Plan: 1. BV (bacterial vaginosis) -  metroNIDAZOLE (FLAGYL) 500 MG tablet; Take 1 tablet (500 mg total) by mouth 2 (two) times daily.  Dispense: 14 tablet; Refill: 0  2. Hip pain, chronic, right Follow up with specialist   Bari Edward, MD [facility]  03/18/2016

## 2016-04-08 ENCOUNTER — Ambulatory Visit
Admission: RE | Admit: 2016-04-08 | Discharge: 2016-04-08 | Disposition: A | Discharge status: Home / Self Care | Payer: BLUE CROSS/BLUE SHIELD | Source: Ambulatory Visit | Attending: Physician Assistant | Admitting: Physician Assistant

## 2016-04-08 DIAGNOSIS — Q6589 Other specified congenital deformities of hip: Secondary | ICD-10-CM | POA: Diagnosis present

## 2016-04-08 DIAGNOSIS — M7061 Trochanteric bursitis, right hip: Secondary | ICD-10-CM | POA: Diagnosis present

## 2016-04-08 DIAGNOSIS — M769 Unspecified enthesopathy, lower limb, excluding foot: Secondary | ICD-10-CM | POA: Insufficient documentation

## 2016-04-08 DIAGNOSIS — M1611 Unilateral primary osteoarthritis, right hip: Secondary | ICD-10-CM | POA: Insufficient documentation

## 2016-04-08 DIAGNOSIS — M76891 Other specified enthesopathies of right lower limb, excluding foot: Secondary | ICD-10-CM

## 2016-04-08 DIAGNOSIS — M25551 Pain in right hip: Secondary | ICD-10-CM

## 2016-04-08 MED ORDER — SODIUM CHLORIDE 0.9 % IJ SOLN
10.0000 mL | INTRAMUSCULAR | Status: DC | PRN
  Administered 2016-04-08: 10 mL
  Filled 2016-04-08: qty 10

## 2016-04-08 MED ORDER — GADOBENATE DIMEGLUMINE 529 MG/ML IV SOLN
0.1000 mL | Freq: Once | INTRAVENOUS | Status: AC | PRN
  Administered 2016-04-08: 0.1 mL via INTRA_ARTICULAR

## 2016-06-15 ENCOUNTER — Ambulatory Visit
Admission: EM | Admit: 2016-06-15 | Discharge: 2016-06-15 | Disposition: A | Discharge status: Home / Self Care | Payer: BLUE CROSS/BLUE SHIELD | Attending: Emergency Medicine | Admitting: Emergency Medicine

## 2016-06-15 ENCOUNTER — Encounter: Payer: Self-pay | Admitting: *Deleted

## 2016-06-15 ENCOUNTER — Ambulatory Visit (INDEPENDENT_AMBULATORY_CARE_PROVIDER_SITE_OTHER): Payer: BLUE CROSS/BLUE SHIELD

## 2016-06-15 DIAGNOSIS — W19XXXA Unspecified fall, initial encounter: Secondary | ICD-10-CM

## 2016-06-15 DIAGNOSIS — M25551 Pain in right hip: Secondary | ICD-10-CM | POA: Diagnosis not present

## 2016-06-15 DIAGNOSIS — S73101A Unspecified sprain of right hip, initial encounter: Secondary | ICD-10-CM

## 2016-06-15 LAB — PREGNANCY, URINE: PREG TEST UR: NEGATIVE

## 2016-06-15 MED ORDER — TRAMADOL HCL 50 MG PO TABS: ORAL_TABLET | ORAL | 0 refills | Status: DC

## 2016-06-15 MED ORDER — DICLOFENAC SODIUM 75 MG PO TBEC: 75.0000 mg | DELAYED_RELEASE_TABLET | Freq: Two times a day (BID) | ORAL | 0 refills | Status: DC

## 2016-06-15 MED ORDER — METHOCARBAMOL 750 MG PO TABS: 750.0000 mg | ORAL_TABLET | ORAL | 0 refills | Status: DC

## 2016-06-15 MED ORDER — PREDNISONE 10 MG (21) PO TBPK: ORAL_TABLET | ORAL | 0 refills | Status: DC

## 2016-06-15 NOTE — ED Triage Notes (Signed)
Pt tripped and nearly fell 2 days ago. Now c/o right hip pain and difficulty bearing weight on that hip. Hx of previous right hip problems and dx with arthritis.

## 2016-06-15 NOTE — ED Provider Notes (Signed)
HPI  SUBJECTIVE:  Monica Robbins is a 40 y.o. female who presents with right hip pain that she describes as intermittently sharp, burning, and constant pressure. She states that she feels the bone grinding against itself. This started 2 days ago after she had a trip and fall. She did not fall directly on her hip, her husband caught her. Symptoms are worse with weightbearing, standing, walking, better with heat, massage, K tape, Aleve. She reports radiation of the hip pain into her lower back and down her proximal leg to her knee She has not tried anything else for this. She reports bruising on her back where her husband caught her, but denies new numbness, tingling, deformity or color changes. She had symptoms like this before, was treated successfully with muscle relaxants and rest for several days. 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 LMP: 11/10 denies possibility being pregnant. PMD Dr. Judithann Graves. Orthopedic surgery, [facility]. Patient states that she is trying to schedule an appointment now.    Past Medical History:  Diagnosis Date  . HPV in female   . PCOS (polycystic ovarian syndrome)   . Sickle cell trait [facility])     Past Surgical History:  Procedure Laterality Date  . CERVICAL BIOPSY  W/ LOOP ELECTRODE EXCISION  1999   abnormal Pap  . HIP SURGERY Right    19mo old    Family History  Problem Relation Age of Onset  . Diabetes Father   . Sickle cell trait Father     Social History  Substance Use Topics  . Smoking status: Never Smoker  . Smokeless tobacco: Never Used  . Alcohol use 0.0 oz/week     Comment: occasional    No current facility-administered medications for this encounter.   Current Outpatient Prescriptions:  .  Ascorbic Acid (VITA-C PO), Take by mouth., Disp: , Rfl:  .  CRANBERRY SOFT PO, Take by mouth., Disp: ,  Rfl:  .  Cyanocobalamin (RA VITAMIN B-12 TR) 1000 MCG TBCR, Take 1 tablet by mouth daily., Disp: , Rfl:  .  Ginkgo Biloba 40 MG TABS, Take 1 tablet by mouth daily., Disp: , Rfl:  .  Ginkgo Biloba 40 MG TABS, Take by mouth., Disp: , Rfl:  .  magnesium oxide (MAG-OX) 400 MG tablet, Take 400 mg by mouth daily., Disp: , Rfl:  .  Multiple Vitamin (MULTI-VITAMINS) TABS, Take 1 tablet by mouth daily., Disp: , Rfl:  .  pyridOXINE (B-6) 50 MG tablet, Take 50 mg by mouth., Disp: , Rfl:  .  diclofenac (VOLTAREN) 75 MG EC tablet, Take 1 tablet (75 mg total) by mouth 2 (two) times daily. Take with food, Disp: 30 tablet, Rfl: 0 .  EPINEPHrine 0.3 mg/0.3 mL IJ SOAJ injection, Inject 0.3 mLs (0.3 mg total) into the muscle once., Disp: 2 Device, Rfl: 1 .  fluticasone (FLONASE) 50 MCG/ACT nasal spray, Place 2 sprays into both nostrils daily. (Patient not taking: Reported on 06/15/2016), Disp: 16 g, Rfl: 2 .  methocarbamol (ROBAXIN) 750 MG tablet, Take 1 tablet (750 mg total) by mouth every 4 (four) hours., Disp: 40 tablet, Rfl: 0 .  predniSONE (STERAPRED UNI-PAK 21 TAB) 10 MG (21) TBPK tablet, Dispense one 6 day pack. Take as directed with food., Disp: 21 tablet, Rfl: 0 .  traMADol (ULTRAM) 50 MG tablet, 1-2 tabs po q 6 hr prn pain  Maximum dose= 8 tablets per day, Disp: 20 tablet, Rfl: 0  Allergies  Allergen Reactions  . Iodinated Diagnostic Agents Anaphylaxis  . Iodine Anaphylaxis     ROS  As noted in HPI.   Physical Exam  BP 111/77 (BP Location: Left Arm)   Pulse 93   Temp 98.3 F (36.8 C) (Oral)   Resp 16   Ht 4\' 11"  (1.499 m)   Wt 178 lb (80.7 kg)   LMP 05/30/2016 (Exact Date) Comment: neg preg test  SpO2 100%   BMI 35.95 kg/m   Constitutional: Well developed, well nourished, no acute distress Eyes:  EOMI, conjunctiva normal bilaterally HENT: Normocephalic, atraumatic,mucus membranes moist Respiratory: Normal inspiratory effort Cardiovascular: Normal rate GI: nondistended skin: No  rash, skin intact Musculoskeletal:  noTenderness over the back. No L-spine tenderness. Diffuse muscular tenderness over R gluteal muscles, greater trochanter,  down IT band,  No pain with passive abduction/adduction of leg. + pain with int/ext rotation hip.+  tenderness at sciatic notch. Roll test for muscle spasm negative. Flexion/extension knee WNL. Knee joint NT. Motor strenght flexion/ext hip 5/5. Sensation to LT intact. DP 2+ patient ambulatory in the department but gait antalgic Neurologic: Alert & oriented x 3, no focal neuro deficits Psychiatric: Speech and behavior appropriate   ED Course   Medications - No data to display  Orders Placed This Encounter  Procedures  . DG Hip Unilat With Pelvis 2-3 Views Right    Standing Status:   Standing    Number of Occurrences:   1    Order Specific Question:   Symptom/Reason for Exam    Answer:   Fall [[id number]]  . Pregnancy, urine    Standing Status:   Standing    Number of Occurrences:   1    No results found for this or any previous visit (from the past 24 hour(s)). Dg Hip Unilat With Pelvis 2-3 Views Right  Result Date: 06/15/2016 CLINICAL DATA:  Larey SeatFell 2 days ago with right hip pain, history of hip dysplasia as a child, difficulty with weight-bearing EXAM: DG HIP (WITH OR WITHOUT PELVIS) 2-3V RIGHT COMPARISON:  None. FINDINGS: There is significantly age advanced degenerative joint disease of the right hip with complete loss of joint space superiorly. There is also sclerosis, eburnation, and subchondral cyst formation present. The left hip joint is unremarkable. The pelvic rami are intact. The SI joints are corticated. No acute fracture is seen. IMPRESSION: Significant age advanced degenerative joint disease the right hip. No acute fracture Electronically Signed   By: Dwyane DeePaul  Barry M.D.   On: 06/15/2016 14:55    ED Clinical Impression  Right hip pain  Fall - Plan: DG Hip Unilat With Pelvis 2-3 Views Right, DG Hip Unilat With Pelvis 2-3  Views Right  Sprain of right hip, initial encounter  Fall, initial encounter   ED Assessment/Plan  [facility]. Last opiate prescription was on 12/19/2015, tramadol, #60. No other opiate prescriptions in the past 6 months   Reviewed imaging independently. No acute fractures. Significant advanced degenerative joint disease. See radiology report for details.  Urine pregnancy negative  Patient was given a cane by orthopedics yesterday. She is to use this at all times. Home with diclofenac for 10 days, prednisone taper, muscle relaxant. 3 day work note. Follow up with her orthopedic surgeon/specialists ASAP, or primary care physician as needed. To the ER if gets worse.  Discussed  imaging, MDM, plan and followup with patient . Discussed sn/sx that should  prompt return to the ED. Patient agrees with plan.   Meds ordered this encounter  Medications  . diclofenac (VOLTAREN) 75 MG EC tablet    Sig: Take 1 tablet (75 mg total) by mouth 2 (two) times daily. Take with food    Dispense:  30 tablet    Refill:  0  . methocarbamol (ROBAXIN) 750 MG tablet    Sig: Take 1 tablet (750 mg total) by mouth every 4 (four) hours.    Dispense:  40 tablet    Refill:  0  . traMADol (ULTRAM) 50 MG tablet    Sig: 1-2 tabs po q 6 hr prn pain Maximum dose= 8 tablets per day    Dispense:  20 tablet    Refill:  0  . predniSONE (STERAPRED UNI-PAK 21 TAB) 10 MG (21) TBPK tablet    Sig: Dispense one 6 day pack. Take as directed with food.    Dispense:  21 tablet    Refill:  0    *This clinic note was created using Scientist, clinical (histocompatibility and immunogenetics)Dragon dictation software. Therefore, there may be occasional mistakes despite careful proofreading.  ?   Domenick GongAshley Nekhi Liwanag, MD 06/17/16 1012

## 2016-08-09 ENCOUNTER — Encounter: Payer: Self-pay | Admitting: *Deleted

## 2016-08-09 ENCOUNTER — Ambulatory Visit
Admission: EM | Admit: 2016-08-09 | Discharge: 2016-08-09 | Disposition: A | Discharge status: Home / Self Care | Payer: BLUE CROSS/BLUE SHIELD | Attending: Family Medicine | Admitting: Family Medicine

## 2016-08-09 DIAGNOSIS — R300 Dysuria: Secondary | ICD-10-CM | POA: Diagnosis not present

## 2016-08-09 DIAGNOSIS — R21 Rash and other nonspecific skin eruption: Secondary | ICD-10-CM | POA: Diagnosis not present

## 2016-08-09 LAB — URINALYSIS, COMPLETE (UACMP) WITH MICROSCOPIC
BILIRUBIN URINE: NEGATIVE
Glucose, UA: NEGATIVE mg/dL
Hgb urine dipstick: NEGATIVE
Ketones, ur: NEGATIVE mg/dL
Leukocytes, UA: NEGATIVE
NITRITE: NEGATIVE
PH: 6 (ref 5.0–8.0)
Protein, ur: NEGATIVE mg/dL
RBC / HPF: NONE SEEN RBC/hpf (ref 0–5)
SPECIFIC GRAVITY, URINE: 1.02 (ref 1.005–1.030)

## 2016-08-09 LAB — PREGNANCY, URINE: PREG TEST UR: NEGATIVE

## 2016-08-09 MED ORDER — PREDNISONE 10 MG PO TABS: ORAL_TABLET | ORAL | 0 refills | Status: DC

## 2016-08-09 MED ORDER — RANITIDINE HCL 150 MG PO TABS: 150.0000 mg | ORAL_TABLET | Freq: Two times a day (BID) | ORAL | 0 refills | Status: DC

## 2016-08-09 NOTE — ED Provider Notes (Signed)
[facility] ____________________________________________  Time seen: Approximately 1900  I have reviewed the triage vital signs and the nursing notes.   HISTORY  Chief Complaint Rash   HPI Monica Robbins is a 41 y.o. female presenting for the complaints of rash. Patient reports she has had intermittent itchy red raised rash that changes in locations over the last week. Patient reports approximately one month ago she had an abscessed tooth in which she was seen by her dentist, started on oral antibiotic. Patient reports that that specific antibiotic started her to have itching without rash, primarily in the vaginal area without discharge, states that she called her dentist and had the antibiotic changed. Patient states that she was then placed on another antibiotic in which she took to completion and states dental abscess fully resolved and she was not having any itching at that time. Patient reports within a few days of finishing antibiotics she had onset of rash. Patient states she does not know what kind of antibiotic she was on. Patient states that she generally has sensitive skin and environmental allergies. Patient reports she and her husband has gone to the house and cannot identify any trigger. Patient expressed concern over antibiotic allergy. Patient states the only other change in her house was her husband's body wash in which she tested on her skin and did cause a red itchy rash, but reports everything has since been washed and she does not feel like this is the trigger. Patient reports over-the-counter Benadryl does help resolve rash temporarily, but reports rash returns.Denies others in household with similar. Denies any other changes in foods, medicines, lotions, detergents or medications. Denies history of same in the past.  Patient does report some decrease in flow of urination with some frequency in the last few days.  denies pain with urination, vaginal complaints,  vaginal discharge or pelvic pain. Denies pregnancy. Denies any facial swelling, lip swelling, tongue swelling or difficulty swallowing. Denies any extremity swelling. Patient again states unsure of name of antibiotic. Denies chest pain, shortness of breath, abdominal pain, extremity pain, extremity swelling, or fevers. Reports continues to eat and drink well.  Patient's last menstrual period was 08/02/2016. Denies pregnancy Bari Edward, MD: PCP   Past Medical History:  Diagnosis Date  . HPV in female   . PCOS (polycystic ovarian syndrome)   . Sickle cell trait [facility])     Patient Active Problem List   Diagnosis Date Noted  . Abnormal mammogram of right breast 11/21/2015  . Hip pain, chronic 08/06/2015  . Sickle cell trait (HCC) 08/06/2015  . Menstrual irregularity 08/06/2015    Past Surgical History:  Procedure Laterality Date  . CERVICAL BIOPSY  W/ LOOP ELECTRODE EXCISION  1999   abnormal Pap  . HIP SURGERY Right    1mo old    Current Outpatient Rx  . Order #: [id number] Class: Historical Med  . Order #: [id number] Class: Historical Med  . Order #: [id number] Class: Historical Med  . Order #: [id number] Class: Historical Med  . Order #: [id number] Class: Historical Med  . Order #: [id number] Class: Historical Med  . Order #: [id number] Class: Print  . Order #: [id number] Class: Normal  . Order #: [id number] Class: Print  . Order #: [id number] Class: Historical Med  . Order #: [id number] Class: Print  . Order #: [id number] Class: Historical Med  . Order #: [id number] Class: Normal  . Order #: [id number] Class: Normal  . Order #: [id number] Class: Print    No current facility-administered medications for this encounter.   Current Outpatient Prescriptions:  .  CRANBERRY SOFT PO, Take by mouth., Disp: , Rfl:  .  Cyanocobalamin (RA VITAMIN B-12 TR) 1000 MCG TBCR, Take 1 tablet by mouth daily., Disp: , Rfl:  .  Ginkgo Biloba 40 MG TABS, Take 1 tablet by mouth daily., Disp: , Rfl:  .  magnesium  oxide (MAG-OX) 400 MG tablet, Take 400 mg by mouth daily., Disp: , Rfl:  .  pyridOXINE (B-6) 50 MG tablet, Take 50 mg by mouth., Disp: , Rfl:  .  Ascorbic Acid (VITA-C PO), Take by mouth., Disp: , Rfl:  .  diclofenac (VOLTAREN) 75 MG EC tablet, Take 1 tablet (75 mg total) by mouth 2 (two) times daily. Take with food, Disp: 30 tablet, Rfl: 0 .  EPINEPHrine 0.3 mg/0.3 mL IJ SOAJ injection, Inject 0.3 mLs (0.3 mg total) into the muscle once., Disp: 2 Device, Rfl: 1 .  fluticasone (FLONASE) 50 MCG/ACT nasal spray, Place 2 sprays into both nostrils daily. (Patient not taking: Reported on 06/15/2016), Disp: 16 g, Rfl: 2 .  Ginkgo Biloba 40 MG TABS, Take by mouth., Disp: , Rfl:  .  methocarbamol (ROBAXIN) 750 MG tablet, Take 1 tablet (750 mg total) by mouth every 4 (four) hours., Disp: 40 tablet, Rfl: 0 .  Multiple Vitamin (MULTI-VITAMINS) TABS, Take 1 tablet by mouth daily., Disp: , Rfl:  .  predniSONE (DELTASONE) 10 MG tablet, Start 60 mg po day one, then 50 mg po day two, taper by 10 mg daily until complete., Disp: 21 tablet, Rfl: 0 .  ranitidine (ZANTAC) 150 MG tablet, Take 1 tablet (150 mg total) by mouth 2 (two) times daily., Disp: 10 tablet, Rfl: 0 .  traMADol (ULTRAM) 50 MG tablet, 1-2 tabs po q 6 hr prn pain Maximum dose= 8 tablets per day, Disp: 20 tablet, Rfl: 0  Allergies Iodinated diagnostic agents and Iodine  Family History  Problem Relation Age of Onset  . Diabetes Father   . Sickle cell trait Father     Social History Social History  Substance Use Topics  . Smoking status: Never Smoker  . Smokeless tobacco: Never Used  . Alcohol use 0.0 oz/week     Comment: occasional    Review of Systems Constitutional: No fever/chills Eyes: No visual changes. ENT: No sore throat. Cardiovascular: Denies chest pain. Respiratory: Denies shortness of breath. Gastrointestinal: No abdominal pain.  No nausea, no vomiting.  No diarrhea.  No constipation. Genitourinary: Negative for  dysuria. Musculoskeletal: Negative for back pain. Skin: Positive for rash. Neurological: Negative for headaches, focal weakness or numbness.  10-point ROS otherwise negative.  ____________________________________________   PHYSICAL EXAM:  VITAL SIGNS: ED Triage Vitals  Enc Vitals Group     BP 08/09/16 1713 (!) 110/48     Pulse Rate 08/09/16 1713 82     Resp 08/09/16 1713 16     Temp 08/09/16 1713 98.5 F (36.9 C)     Temp Source 08/09/16 1713 Oral     SpO2 08/09/16 1713 91 %     Weight --      Height --      Head Circumference --      Peak Flow --      Pain Score 08/09/16 1722 0     Pain Loc --      Pain Edu? --      Excl. in GC? --     Constitutional: Alert and oriented. Well appearing and in no acute distress. Eyes: Conjunctivae are normal. PERRL. EOMI. ENT  Head: Normocephalic and atraumatic.      Nose: No congestion/rhinnorhea.      Mouth/Throat: Mucous membranes are moist.Oropharynx non-erythematous.No tonsillar swelling. No lip, tongue or oral swelling noted.  Neck: No stridor. Supple without meningismus.  Hematological/Lymphatic/Immunilogical: No cervical lymphadenopathy. Cardiovascular: Normal rate, regular rhythm. Grossly normal heart sounds.  Good peripheral circulation. Respiratory: Normal respiratory effort without tachypnea nor retractions. Breath sounds are clear and equal bilaterally. No wheezes/rales/rhonchi.. Gastrointestinal: Soft and nontender. No distention. No CVA tenderness. Musculoskeletal:  Nontender with normal range of motion in all extremities. No midline cervical, thoracic or lumbar tenderness to palpation.  Neurologic:  Normal speech and language. Speech is normal. No gait instability.  Skin:  Skin is warm, dry and intact. No rash noted. Except : Scattered areas of mildly erythematous hives present to right neck, right upper arm, left forearm and back, puritic, no induration, no fluctuance, no drainage, no excoriation .  Psychiatric:  Mood and affect are normal. Speech and behavior are normal. Patient exhibits appropriate insight and judgment   ___________________________________________   LABS (all labs ordered are listed, but only abnormal results are displayed)  Labs Reviewed  URINALYSIS, COMPLETE (UACMP) WITH MICROSCOPIC - Abnormal; Notable for the following:       Result Value   Squamous Epithelial / LPF 6-30 (*)    Bacteria, UA FEW (*)    All other components within normal limits  URINE CULTURE  PREGNANCY, URINE   ____________________________________________   PROCEDURES Procedures   INITIAL IMPRESSION / ASSESSMENT AND PLAN / ED COURSE  Pertinent labs & imaging results that were available during my care of the patient were reviewed by me and considered in my medical decision making (see chart for details).  Well-appearing patient. No acute distress. Discussed in detail with patient concern for allergen that she may be common in contact with a regular basis as rash improves and then returns. Patient expressed concerned over recent antibiotic. Patient also states some urinary changes, denies vaginal complaints of burning with urination. Urinalysis reviewed, few bacteria, clear with 6-30 squamous epithelial cells, discussed with patient suspect contaminated sample and will await culture urine prior to initiating antibiotics. Will treat rash with oral prednisone taper, continue intermittent Benadryl at home and Zantac. Counseled regarding close monitoring and identification of trigger. Follow-up with primary care physician, as well as discussed allergy testing. Discussed indication, risks and benefits of medications with patient.  Discussed follow up with Primary care physician this week. Discussed follow up and return parameters including no resolution or any worsening concerns. Patient verbalized understanding and agreed to plan.   ____________________________________________   FINAL CLINICAL IMPRESSION(S) /  ED DIAGNOSES  Final diagnoses:  Rash  Dysuria     Discharge Medication List as of 08/09/2016  7:35 PM    START taking these medications   Details  predniSONE (DELTASONE) 10 MG tablet Start 60 mg po day one, then 50 mg po day two, taper by 10 mg daily until complete., Normal    ranitidine (ZANTAC) 150 MG tablet Take 1 tablet (150 mg total) by mouth 2 (two) times daily., Starting Mon 08/09/2016, Until Sat 08/14/2016, Normal        Note: This dictation was prepared with Dragon dictation along with smaller phrase technology. Any transcriptional errors that result from this process are unintentional.         Renford Dills, NP 08/09/16 2108    Renford Dills, NP 08/09/16 2111

## 2016-08-09 NOTE — Discharge Instructions (Signed)
Take medication as prescribed. Rest. Drink plenty of fluids.Monitor for triggers. Avoid scratching.   ° °Follow up with your primary care physician this week as needed. Return to Urgent care for new or worsening concerns.  ° °

## 2016-08-09 NOTE — ED Triage Notes (Signed)
Patient has symptoms of rash that has lasted 2 weeks. Patient is unsure what is causing the rash.

## 2016-08-13 LAB — URINE CULTURE: Culture: >=100000 — AB

## 2016-08-17 ENCOUNTER — Encounter: Payer: Self-pay | Admitting: *Deleted

## 2016-08-17 ENCOUNTER — Telehealth: Payer: Self-pay

## 2016-08-17 ENCOUNTER — Ambulatory Visit
Admission: EM | Admit: 2016-08-17 | Discharge: 2016-08-17 | Disposition: A | Discharge status: Home / Self Care | Payer: BLUE CROSS/BLUE SHIELD | Attending: Family Medicine | Admitting: Family Medicine

## 2016-08-17 DIAGNOSIS — L509 Urticaria, unspecified: Secondary | ICD-10-CM

## 2016-08-17 MED ORDER — CEPHALEXIN 500 MG PO CAPS: 500.0000 mg | ORAL_CAPSULE | Freq: Two times a day (BID) | ORAL | 0 refills | Status: AC

## 2016-08-17 MED ORDER — RANITIDINE HCL 150 MG PO TABS: 150.0000 mg | ORAL_TABLET | Freq: Two times a day (BID) | ORAL | 0 refills | Status: DC

## 2016-08-17 NOTE — ED Triage Notes (Signed)
Pt seen here 2 days ago for a generalized rash. Pt returns today as she is no better.

## 2016-08-17 NOTE — ED Provider Notes (Signed)
[facility]    CSN: [id number] Arrival date & time: 08/17/16  1825     History   Chief Complaint Chief Complaint  Patient presents with  . Rash    HPI Monica Robbins is a 41 y.o. female.    Rash  Location:  Full body Quality: itchiness   Quality comment:  Hives Timing:  Intermittent Progression:  Waxing and waning Chronicity:  New Context: medications (recent antibiotic, now finished) and sick contacts   Context: not animal contact, not chemical exposure, not diapers, not eggs, not exposure to similar rash, not food, not hot tub use, not insect bite/sting, not new detergent/soap, not nuts, not plant contact, not pollen, not pregnancy and not sun exposure   Relieved by:  Antihistamines (briefly, intermittently) Ineffective treatments:  Moisturizers Associated symptoms: no abdominal pain, no diarrhea, no fatigue, no fever, no headaches, no hoarse voice, no induration, no joint pain, no myalgias, no nausea, no periorbital edema, no shortness of breath, no sore throat, no throat swelling, no URI, not vomiting and not wheezing     Past Medical History:  Diagnosis Date  . HPV in female   . PCOS (polycystic ovarian syndrome)   . Sickle cell trait [facility](HCC)     Patient Active Problem List   Diagnosis Date Noted  . Abnormal mammogram of right breast 11/21/2015  . Hip pain, chronic 08/06/2015  . Sickle cell trait (HCC) 08/06/2015  . Menstrual irregularity 08/06/2015    Past Surgical History:  Procedure Laterality Date  . CERVICAL BIOPSY  W/ LOOP ELECTRODE EXCISION  1999   abnormal Pap  . HIP SURGERY Right    41mo old    OB History    No data available       Home Medications    Prior to Admission medications   Medication Sig Start Date End Date Taking? Authorizing Provider  Ascorbic Acid (VITA-C PO) Take by mouth.    Historical Provider, MD  cephALEXin (KEFLEX) 500 MG capsule Take 1 capsule (500 mg total) by mouth 2 (two) times daily. 08/17/16 08/24/16   Hassan RowanEugene Wade, MD  CRANBERRY SOFT PO Take by mouth.    Historical Provider, MD  Cyanocobalamin (RA VITAMIN B-12 TR) 1000 MCG TBCR Take 1 tablet by mouth daily.    Historical Provider, MD  diclofenac (VOLTAREN) 75 MG EC tablet Take 1 tablet (75 mg total) by mouth 2 (two) times daily. Take with food 06/15/16   Domenick GongAshley Mortenson, MD  EPINEPHrine 0.3 mg/0.3 mL IJ SOAJ injection Inject 0.3 mLs (0.3 mg total) into the muscle once. 11/05/15   Reubin MilanLaura H Berglund, MD  fluticasone (FLONASE) 50 MCG/ACT nasal spray Place 2 sprays into both nostrils daily. Patient not taking: Reported on 06/15/2016 06/27/15   Lutricia FeilWilliam P Roemer, PA-C  Ginkgo Biloba 40 MG TABS Take 1 tablet by mouth daily.    Historical Provider, MD  Ginkgo Biloba 40 MG TABS Take by mouth.    Historical Provider, MD  magnesium oxide (MAG-OX) 400 MG tablet Take 400 mg by mouth daily.    Historical Provider, MD  methocarbamol (ROBAXIN) 750 MG tablet Take 1 tablet (750 mg total) by mouth every 4 (four) hours. 06/15/16   Domenick GongAshley Mortenson, MD  Multiple Vitamin (MULTI-VITAMINS) TABS Take 1 tablet by mouth daily.    Historical Provider, MD  predniSONE (DELTASONE) 10 MG tablet Start 60 mg po day one, then 50 mg po day two, taper by 10 mg daily until complete. 08/09/16   Renford DillsLindsey Miller, NP  pyridOXINE (  B-6) 50 MG tablet Take 50 mg by mouth.    Historical Provider, MD  ranitidine (ZANTAC) 150 MG tablet Take 1 tablet (150 mg total) by mouth 2 (two) times daily. 08/17/16   Payton Mccallum, MD  traMADol (ULTRAM) 50 MG tablet 1-2 tabs po q 6 hr prn pain Maximum dose= 8 tablets per day 06/15/16   Domenick Gong, MD    Family History Family History  Problem Relation Age of Onset  . Diabetes Father   . Sickle cell trait Father     Social History Social History  Substance Use Topics  . Smoking status: Never Smoker  . Smokeless tobacco: Never Used  . Alcohol use 0.0 oz/week     Comment: occasional     Allergies   Iodinated diagnostic agents and  Iodine   Review of Systems Review of Systems  Constitutional: Negative for fatigue and fever.  HENT: Negative for hoarse voice and sore throat.   Respiratory: Negative for shortness of breath and wheezing.   Gastrointestinal: Negative for abdominal pain, diarrhea, nausea and vomiting.  Musculoskeletal: Negative for arthralgias and myalgias.  Skin: Positive for rash.  Neurological: Negative for headaches.     Physical Exam Triage Vital Signs ED Triage Vitals  Enc Vitals Group     BP 08/17/16 1840 127/78     Pulse Rate 08/17/16 1840 (!) 105     Resp 08/17/16 1840 16     Temp 08/17/16 1840 98.5 F (36.9 C)     Temp Source 08/17/16 1840 Oral     SpO2 08/17/16 1840 100 %     Weight 08/17/16 1840 176 lb (79.8 kg)     Height 08/17/16 1840 4\' 11"  (1.499 m)     Head Circumference --      Peak Flow --      Pain Score 08/17/16 1908 0     Pain Loc --      Pain Edu? --      Excl. in GC? --    No data found.   Updated Vital Signs BP 127/78 (BP Location: Left Arm)   Pulse (!) 105   Temp 98.5 F (36.9 C) (Oral)   Resp 16   Ht 4\' 11"  (1.499 m)   Wt 176 lb (79.8 kg)   LMP 08/02/2016   SpO2 100%   BMI 35.55 kg/m   Visual Acuity Right Eye Distance:   Left Eye Distance:   Bilateral Distance:    Right Eye Near:   Left Eye Near:    Bilateral Near:     Physical Exam  Constitutional: She appears well-developed and well-nourished. No distress.  HENT:  Head: Normocephalic and atraumatic.  Right Ear: Tympanic membrane, external ear and ear canal normal.  Left Ear: Tympanic membrane, external ear and ear canal normal.  Nose: No mucosal edema, rhinorrhea, nose lacerations, sinus tenderness, nasal deformity, septal deviation or nasal septal hematoma. No epistaxis.  No foreign bodies. Right sinus exhibits no maxillary sinus tenderness and no frontal sinus tenderness. Left sinus exhibits no maxillary sinus tenderness and no frontal sinus tenderness.  Mouth/Throat: Uvula is midline,  oropharynx is clear and moist and mucous membranes are normal. No oropharyngeal exudate.  Eyes: Conjunctivae and EOM are normal. Right eye exhibits no discharge. Left eye exhibits no discharge. No scleral icterus.  Neck: Normal range of motion. Neck supple. No thyromegaly present.  Cardiovascular: Normal rate, regular rhythm and normal heart sounds.   Pulmonary/Chest: Effort normal and breath sounds normal. No respiratory distress. She  has no wheezes. She has no rales.  Lymphadenopathy:    She has no cervical adenopathy.  Skin: Rash noted. Rash is urticarial. She is not diaphoretic.  Nursing note and vitals reviewed.    UC Treatments / Results  Labs (all labs ordered are listed, but only abnormal results are displayed) Labs Reviewed - No data to display  EKG  EKG Interpretation None       Radiology No results found.  Procedures Procedures (including critical care time)  Medications Ordered in UC Medications - No data to display   Initial Impression / Assessment and Plan / UC Course  I have reviewed the triage vital signs and the nursing notes.  Pertinent labs & imaging results that were available during my care of the patient were reviewed by me and considered in my medical decision making (see chart for details).       Final Clinical Impressions(s) / UC Diagnoses   Final diagnoses:  Urticaria  (unknown etiology; intermittent/recurring)  New Prescriptions Discharge Medication List as of 08/17/2016  7:06 PM     1. diagnosis reviewed with patient 2. rx as per orders above; reviewed possible side effects, interactions, risks and benefits  3. Recommend supportive treatment with otc antihistamines 4. Follow-up prn if symptoms worsen or don't improve   Payton Mccallum, MD 08/17/16 1920

## 2017-01-04 ENCOUNTER — Ambulatory Visit (INDEPENDENT_AMBULATORY_CARE_PROVIDER_SITE_OTHER): Payer: BLUE CROSS/BLUE SHIELD

## 2017-01-04 ENCOUNTER — Ambulatory Visit
Admission: EM | Admit: 2017-01-04 | Discharge: 2017-01-04 | Disposition: A | Discharge status: Home / Self Care | Payer: BLUE CROSS/BLUE SHIELD | Attending: Family Medicine | Admitting: Family Medicine

## 2017-01-04 ENCOUNTER — Encounter: Payer: Self-pay | Admitting: Gynecology

## 2017-01-04 DIAGNOSIS — M25532 Pain in left wrist: Secondary | ICD-10-CM | POA: Diagnosis not present

## 2017-01-04 DIAGNOSIS — N6452 Nipple discharge: Secondary | ICD-10-CM | POA: Diagnosis not present

## 2017-01-04 DIAGNOSIS — S60212A Contusion of left wrist, initial encounter: Secondary | ICD-10-CM

## 2017-01-04 LAB — PREGNANCY, URINE: PREG TEST UR: NEGATIVE

## 2017-01-04 MED ORDER — MELOXICAM 15 MG PO TABS: 15.0000 mg | ORAL_TABLET | Freq: Every day | ORAL | 0 refills | Status: DC

## 2017-01-04 NOTE — ED Provider Notes (Signed)
[facility]    CSN: [id number] Arrival date & time: 01/04/17  1511     History   Chief Complaint Chief Complaint  Patient presents with  . Wrist Pain    HPI Monica Robbins is a 41 y.o. female.   Patient's here because of multiple issues #1 she states that she was catching her grandson who was diving off the couch. First towards the end of the table and she caught him with her left hand. After the catching him to start having pain in the left hand. She did not think was that bad but which can't work on Monday and difficulty carrying boxes and doing as well. He states it put a but Tuesday she still had trouble functioning at work.  Problem #2 she states that she was doing her usual breast examination when she knows of black hair 100 breast. She removed the black hair but also was found that she had a discharge coming from one of her breast. She checked her of the breast and had a discharge coming from that as well. She is worried now that she may be pregnant because of the discharge from both breast and she's wondering whether she may be going to change life if not being pregnant. She states she gets yearly mammograms. In the discharge was bilateral. Past medical history sickle cell trait PCOS and HPV. She's has cervical biopsy and hip surgery. Father his diabetes is so trait she never smoked. She is allergic to iodine agents and Lodine.   The history is provided by the patient. No language interpreter was used.  Wrist Pain  This is a new problem. The current episode started more than 2 days ago. The problem occurs constantly. The problem has not changed since onset.Pertinent negatives include no chest pain, no abdominal pain, no headaches and no shortness of breath. The symptoms are aggravated by bending and twisting. Nothing relieves the symptoms. She has tried nothing for the symptoms. The treatment provided no relief.    Past Medical History:  Diagnosis Date  . HPV in  female   . PCOS (polycystic ovarian syndrome)   . Sickle cell trait [facility])     Patient Active Problem List   Diagnosis Date Noted  . Abnormal mammogram of right breast 11/21/2015  . Hip pain, chronic 08/06/2015  . Sickle cell trait (HCC) 08/06/2015  . Menstrual irregularity 08/06/2015    Past Surgical History:  Procedure Laterality Date  . CERVICAL BIOPSY  W/ LOOP ELECTRODE EXCISION  1999   abnormal Pap  . HIP SURGERY Right    72mo old    OB History    No data available       Home Medications    Prior to Admission medications   Medication Sig Start Date End Date Taking? Authorizing Provider  Ascorbic Acid (VITA-C PO) Take by mouth.   Yes [provider]  CRANBERRY SOFT PO Take by mouth.   Yes [provider]  Cyanocobalamin (RA VITAMIN B-12 TR) 1000 MCG TBCR Take 1 tablet by mouth daily.   Yes [provider]  EPINEPHrine 0.3 mg/0.3 mL IJ SOAJ injection Inject 0.3 mLs (0.3 mg total) into the muscle once. 11/05/15  Yes Reubin Milan, MD  fluticasone [facility]) 50 MCG/ACT nasal spray Place 2 sprays into both nostrils daily. 06/27/15  Yes Ovid Curd P, PA-C  Ginkgo Biloba 40 MG TABS Take 1 tablet by mouth daily.   Yes [provider]  Ginkgo Biloba 40 MG  TABS Take by mouth.   Yes [provider]  magnesium oxide (MAG-OX) 400 MG tablet Take 400 mg by mouth daily.   Yes [provider]  diclofenac (VOLTAREN) 75 MG EC tablet Take 1 tablet (75 mg total) by mouth 2 (two) times daily. Take with food 06/15/16   Domenick GongMortenson, Ashley, MD  meloxicam (MOBIC) 15 MG tablet Take 1 tablet (15 mg total) by mouth daily. 01/04/17   Hassan RowanWade, Kymberly Blomberg, MD  methocarbamol (ROBAXIN) 750 MG tablet Take 1 tablet (750 mg total) by mouth every 4 (four) hours. 06/15/16   Domenick GongMortenson, Ashley, MD  Multiple Vitamin (MULTI-VITAMINS) TABS Take 1 tablet by mouth daily.    [provider]  predniSONE (DELTASONE) 10 MG tablet Start 60 mg po day one, then  50 mg po day two, taper by 10 mg daily until complete. 08/09/16   Renford DillsMiller, Lindsey, NP  pyridOXINE (B-6) 50 MG tablet Take 50 mg by mouth.    [provider]  ranitidine (ZANTAC) 150 MG tablet Take 1 tablet (150 mg total) by mouth 2 (two) times daily. 08/17/16   Payton Mccallumonty, [place], MD  traMADol (ULTRAM) 50 MG tablet 1-2 tabs po q 6 hr prn pain Maximum dose= 8 tablets per day 06/15/16   Domenick GongMortenson, Ashley, MD    Family History Family History  Problem Relation Age of Onset  . Diabetes Father   . Sickle cell trait Father     Social History Social History  Substance Use Topics  . Smoking status: Never Smoker  . Smokeless tobacco: Never Used  . Alcohol use 0.0 oz/week     Comment: occasional     Allergies   Iodinated diagnostic agents and Iodine   Review of Systems Review of Systems  Respiratory: Negative for shortness of breath.   Cardiovascular: Negative for chest pain.  Gastrointestinal: Negative for abdominal pain.  Musculoskeletal: Positive for arthralgias and myalgias.  Neurological: Negative for headaches.  All other systems reviewed and are negative.    Physical Exam Triage Vital Signs ED Triage Vitals  Enc Vitals Group     BP 01/04/17 1535 107/79     Pulse Rate 01/04/17 1535 90     Resp 01/04/17 1535 16     Temp 01/04/17 1535 99 F (37.2 C)     Temp Source 01/04/17 1535 Oral     SpO2 01/04/17 1535 99 %     Weight 01/04/17 1541 190 lb (86.2 kg)     Height 01/04/17 1541 4\' 11"  (1.499 m)     Head Circumference --      Peak Flow --      Pain Score 01/04/17 1542 6     Pain Loc --      Pain Edu? --      Excl. in GC? --    No data found.   Updated Vital Signs BP 107/79 (BP Location: Left Arm)   Pulse 90   Temp 99 F (37.2 C) (Oral)   Resp 16   Ht 4\' 11"  (1.499 m)   Wt 190 lb (86.2 kg)   LMP 12/17/2016 Comment: for 2 days.  SpO2 99%   BMI 38.38 kg/m   Visual Acuity Right Eye Distance:   Left Eye Distance:   Bilateral Distance:    Right Eye  Near:   Left Eye Near:    Bilateral Near:     Physical Exam  Constitutional: She is oriented to person, place, and time. She appears well-developed and well-nourished.  HENT:  Head: Normocephalic  and atraumatic.  Eyes: Pupils are equal, round, and reactive to light.  Neck: Normal range of motion. Neck supple.  Pulmonary/Chest: Effort normal.  Musculoskeletal: She exhibits tenderness.       Left wrist: She exhibits tenderness and bony tenderness.  Patient is tenderness in the anatomical snuffbox around the navicular bone on the left wrist. Good range of motion.  Neurological: She is alert and oriented to person, place, and time.  Skin: Skin is warm.  Psychiatric: She has a normal mood and affect.  Vitals reviewed.    UC Treatments / Results  Labs (all labs ordered are listed, but only abnormal results are displayed) Labs Reviewed  PREGNANCY, URINE    EKG  EKG Interpretation None       Radiology Dg Wrist Complete Left  Result Date: 01/04/2017 CLINICAL DATA:  Left wrist pain x2 days EXAM: LEFT WRIST - COMPLETE 3+ VIEW COMPARISON:  None. FINDINGS: No fracture or dislocation is seen. The joint spaces are preserved. The visualized soft tissues are unremarkable. IMPRESSION: Negative. Electronically Signed   By: Charline Bills M.D.   On: 01/04/2017 17:19    Procedures Procedures (including critical care time)  Medications Ordered in UC Medications - No data to display   Initial Impression / Assessment and Plan / UC Course  I have reviewed the triage vital signs and the nursing notes.  Pertinent labs & imaging results that were available during my care of the patient were reviewed by me and considered in my medical decision making (see chart for details).     X-ray of the left wrist was negative still recommend thumb spica splint for the release next 10 days and if there is any pain or tenderness of left wrist recommend that she get a repeat x-ray. She can follow-up  with her PCP. We'll place on Mobic 15 mg 1 tablet day. For the rest discharge she's worried about pregnancy plans test was negative she does have yearly mammograms I did recommend to her to see her PCP and have secretions from the breast to make sure there is no abnormalities. Explained to her that we do not have a fixed distance renal to Pap smears here to preserve the breast secretions  Final Clinical Impressions(s) / UC Diagnoses   Final diagnoses:  Contusion of left wrist, initial encounter  Acute pain of left wrist  Discharge of breast    New Prescriptions Discharge Medication List as of 01/04/2017  5:32 PM    START taking these medications   Details  meloxicam (MOBIC) 15 MG tablet Take 1 tablet (15 mg total) by mouth daily., Starting Tue 01/04/2017, Normal        Note: This dictation was prepared with Dragon dictation along with smaller phrase technology. Any transcriptional errors that result from this process are unintentional.   Hassan Rowan, MD 01/04/17 1806

## 2017-01-04 NOTE — ED Triage Notes (Signed)
Patient c/o left wrist pain x yesterday.

## 2017-01-04 NOTE — Discharge Instructions (Signed)
Recommend strongly follow-up with PCP in 1-2 weeks. One way of evaluating breast discharge is to send breast secretions to the lab for still present for evaluation. For the wrist pain since his tenderness in the navicular area recommend that she keep the thumb splint on 24 7 as much as possible until she is seen and reevaluated by PCP in about 10 days and if this any tenderness at all over the area repeat x-rays would be warranted

## 2017-06-08 ENCOUNTER — Ambulatory Visit
Admission: EM | Admit: 2017-06-08 | Discharge: 2017-06-08 | Disposition: A | Discharge status: Home / Self Care | Payer: BLUE CROSS/BLUE SHIELD | Attending: Family Medicine | Admitting: Family Medicine

## 2017-06-08 ENCOUNTER — Encounter: Payer: Self-pay | Admitting: Emergency Medicine

## 2017-06-08 ENCOUNTER — Other Ambulatory Visit: Payer: Self-pay

## 2017-06-08 DIAGNOSIS — J069 Acute upper respiratory infection, unspecified: Secondary | ICD-10-CM | POA: Diagnosis not present

## 2017-06-08 DIAGNOSIS — J029 Acute pharyngitis, unspecified: Secondary | ICD-10-CM | POA: Diagnosis not present

## 2017-06-08 DIAGNOSIS — R05 Cough: Secondary | ICD-10-CM

## 2017-06-08 LAB — RAPID STREP SCREEN (MED CTR MEBANE ONLY): Streptococcus, Group A Screen (Direct): NEGATIVE

## 2017-06-08 MED ORDER — HYDROCOD POLST-CPM POLST ER 10-8 MG/5ML PO SUER: 5.0000 mL | Freq: Two times a day (BID) | ORAL | 0 refills | Status: DC

## 2017-06-08 MED ORDER — BENZONATATE 200 MG PO CAPS: ORAL_CAPSULE | ORAL | 0 refills | Status: DC

## 2017-06-08 MED ORDER — FLUTICASONE PROPIONATE 50 MCG/ACT NA SUSP: 2.0000 | Freq: Every day | NASAL | 0 refills | Status: DC

## 2017-06-08 NOTE — ED Provider Notes (Signed)
[facility]    CSN: [id number] Arrival date & time: 06/08/17  1040     History   Chief Complaint Chief Complaint  Patient presents with  . Generalized Body Aches  . Cough  . Sore Throat    HPI Monica Robbins is a 41 y.o. female.   HPI  Is a 41 year old female who presents with a 4-day history of cough body aches and sore throat.  Her throat pain has been present for approximately 2 days.  She has had no fever or chills.  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  Has persisted and worsened.  Now most of her family has the symptoms.  Number 2 days 99.9 pulse rate of 105 blood pressure 126/63 respiration 16 and O2 sats at 99% room air        Past Medical History:  Diagnosis Date  . HPV in female   . PCOS (polycystic ovarian syndrome)   . Sickle cell trait [facility](HCC)     Patient Active Problem List   Diagnosis Date Noted  . Abnormal mammogram of right breast 11/21/2015  . Hip pain, chronic 08/06/2015  . Sickle cell trait (HCC) 08/06/2015  . Menstrual irregularity 08/06/2015    Past Surgical History:  Procedure Laterality Date  . CERVICAL BIOPSY  W/ LOOP ELECTRODE EXCISION  1999   abnormal Pap  . HIP SURGERY Right    38mo old    OB History    No data available       Home Medications    Prior to Admission medications   Medication Sig Start Date End Date Taking? Authorizing Provider  Ascorbic Acid (VITA-C PO) Take by mouth.    [provider]  benzonatate (TESSALON) 200 MG capsule Take one cap TID PRN cough 06/08/17   Lutricia Feiloemer, William P, PA-C  chlorpheniramine-HYDROcodone [facility](TUSSIONEX PENNKINETIC ER) 10-8 MG/5ML SUER Take 5 mLs by mouth 2 (two) times daily. 06/08/17   Lutricia Feiloemer, William P, PA-C  CRANBERRY SOFT PO Take by mouth.    [provider]  Cyanocobalamin (RA VITAMIN B-12 TR) 1000 MCG TBCR Take 1 tablet by mouth daily.    [provider]  diclofenac (VOLTAREN) 75 MG EC  tablet Take 1 tablet (75 mg total) by mouth 2 (two) times daily. Take with food 06/15/16   Domenick GongMortenson, Ashley, MD  EPINEPHrine 0.3 mg/0.3 mL IJ SOAJ injection Inject 0.3 mLs (0.3 mg total) into the muscle once. 11/05/15   Reubin MilanBerglund, Laura H, MD  fluticasone (FLONASE) 50 MCG/ACT nasal spray Place 2 sprays into both nostrils daily. 06/08/17   Lutricia Feiloemer, William P, PA-C  Ginkgo Biloba 40 MG TABS Take 1 tablet by mouth daily.    [provider]  Ginkgo Biloba 40 MG TABS Take by mouth.    [provider]  magnesium oxide (MAG-OX) 400 MG tablet Take 400 mg by mouth daily.    [provider]  meloxicam (MOBIC) 15 MG tablet Take 1 tablet (15 mg total) by mouth daily. 01/04/17   Hassan RowanWade, Eugene, MD  methocarbamol (ROBAXIN) 750 MG tablet Take 1 tablet (750 mg total) by mouth every 4 (four) hours. 06/15/16   Domenick GongMortenson, Ashley, MD  Multiple Vitamin (MULTI-VITAMINS) TABS Take 1 tablet by mouth daily.    [provider]  predniSONE (DELTASONE) 10 MG tablet Start 60 mg po day one, then 50 mg po day two, taper by 10 mg daily until complete. 08/09/16   Renford DillsMiller, Lindsey,  NP  pyridOXINE (B-6) 50 MG tablet Take 50 mg by mouth.    [provider]  ranitidine (ZANTAC) 150 MG tablet Take 1 tablet (150 mg total) by mouth 2 (two) times daily. 08/17/16   Payton Mccallum, MD  traMADol (ULTRAM) 50 MG tablet 1-2 tabs po q 6 hr prn pain Maximum dose= 8 tablets per day 06/15/16   Domenick Gong, MD    Family History Family History  Problem Relation Age of Onset  . Diabetes Father   . Sickle cell trait Father     Social History Social History   Tobacco Use  . Smoking status: Never Smoker  . Smokeless tobacco: Never Used  Substance Use Topics  . Alcohol use: Yes    Alcohol/week: 0.0 oz    Comment: occasional  . Drug use: No     Allergies   Iodinated diagnostic agents and Iodine   Review of Systems Review of Systems  Constitutional: Positive for activity change. Negative for  appetite change, chills, fatigue and fever.  HENT: Positive for congestion, postnasal drip, rhinorrhea, sneezing and sore throat.   Respiratory: Positive for cough. Negative for shortness of breath, wheezing and stridor.   All other systems reviewed and are negative.    Physical Exam Triage Vital Signs ED Triage Vitals  Enc Vitals Group     BP 06/08/17 1100 126/63     Pulse Rate 06/08/17 1100 (!) 105     Resp 06/08/17 1100 16     Temp 06/08/17 1100 99.9 F (37.7 C)     Temp Source 06/08/17 1100 Oral     SpO2 06/08/17 1100 99 %     Weight 06/08/17 1058 190 lb (86.2 kg)     Height 06/08/17 1058 4\' 11"  (1.499 m)     Head Circumference --      Peak Flow --      Pain Score 06/08/17 1059 3     Pain Loc --      Pain Edu? --      Excl. in GC? --    No data found.  Updated Vital Signs BP 126/63 (BP Location: Left Arm)   Pulse (!) 105   Temp 99.9 F (37.7 C) (Oral)   Resp 16   Ht 4\' 11"  (1.499 m)   Wt 190 lb (86.2 kg)   LMP 05/18/2017   SpO2 99%   BMI 38.38 kg/m   Visual Acuity Right Eye Distance:   Left Eye Distance:   Bilateral Distance:    Right Eye Near:   Left Eye Near:    Bilateral Near:     Physical Exam  Constitutional: She is oriented to person, place, and time. She appears well-developed and well-nourished.  Non-toxic appearance. She does not appear ill. No distress.  HENT:  Head: Normocephalic.  Right Ear: Tympanic membrane and ear canal normal.  Left Ear: Tympanic membrane and ear canal normal.  Mouth/Throat: Oropharynx is clear and moist and mucous membranes are normal. No oropharyngeal exudate, posterior oropharyngeal edema or posterior oropharyngeal erythema.  Eyes: Pupils are equal, round, and reactive to light.  Neck: Normal range of motion.  Pulmonary/Chest: Effort normal and breath sounds normal.  Neurological: She is alert and oriented to person, place, and time.  Skin: Skin is warm and dry.  Psychiatric: She has a normal mood and affect. Her  behavior is normal.  Nursing note and vitals reviewed.    UC Treatments / Results  Labs (all labs ordered are listed, but only abnormal  results are displayed) Labs Reviewed  RAPID STREP SCREEN (NOT [facility])  CULTURE, GROUP A STREP [facility])    EKG  EKG Interpretation None       Radiology No results found.  Procedures Procedures (including critical care time)  Medications Ordered in UC Medications - No data to display   Initial Impression / Assessment and Plan / UC Course  I have reviewed the triage vital signs and the nursing notes.  Pertinent labs & imaging results that were available during my care of the patient were reviewed by me and considered in my medical decision making (see chart for details).     Plan: 1. Test/x-ray results and diagnosis reviewed with patient 2. rx as per orders; risks, benefits, potential side effects reviewed with patient 3. Recommend supportive treatment with rest and fluids.  Told her that this is most likely a viral infection does not require antibiotics.  Cultures will be ready in 48 hours.  In the meantime will give her Tussionex for nighttime use because most of her coughing is when she is recumbent.  Given her Jerilynn Somessalon Perles for daytime use.  Gargles for her throat pain or lozenges whichever is Robbins convenient. 4. F/u prn if symptoms worsen or don't improve   Final Clinical Impressions(s) / UC Diagnoses   Final diagnoses:  Upper respiratory tract infection, unspecified type    ED Discharge Orders        Ordered    benzonatate (TESSALON) 200 MG capsule     06/08/17 1129    chlorpheniramine-HYDROcodone (TUSSIONEX PENNKINETIC ER) 10-8 MG/5ML SUER  2 times daily     06/08/17 1129    fluticasone (FLONASE) 50 MCG/ACT nasal spray  Daily     06/08/17 1129       Controlled Substance Prescriptions [place] Controlled Substance Registry consulted? Not Applicable   Lutricia FeilRoemer, William P, PA-C 06/08/17 1141

## 2017-06-08 NOTE — ED Triage Notes (Signed)
Patient c/o cough, bodyaches and sore throat for 2 days. Patient denies fevers.

## 2017-06-10 LAB — CULTURE, GROUP A STREP (THRC)

## 2017-08-19 ENCOUNTER — Encounter: Payer: Self-pay | Admitting: Emergency Medicine

## 2017-08-19 ENCOUNTER — Ambulatory Visit
Admission: EM | Admit: 2017-08-19 | Discharge: 2017-08-19 | Disposition: A | Discharge status: Home / Self Care | Payer: Managed Care, Other (non HMO) | Attending: Family Medicine | Admitting: Family Medicine

## 2017-08-19 ENCOUNTER — Other Ambulatory Visit: Payer: Self-pay

## 2017-08-19 DIAGNOSIS — N921 Excessive and frequent menstruation with irregular cycle: Secondary | ICD-10-CM | POA: Diagnosis not present

## 2017-08-19 LAB — CBC
HCT: 39.5 % (ref 35.0–47.0)
Hemoglobin: 13.6 g/dL (ref 12.0–16.0)
MCH: 28.2 pg (ref 26.0–34.0)
MCHC: 34.4 g/dL (ref 32.0–36.0)
MCV: 82.1 fL (ref 80.0–100.0)
PLATELETS: 353 10*3/uL (ref 150–440)
RBC: 4.82 MIL/uL (ref 3.80–5.20)
RDW: 13.8 % (ref 11.5–14.5)
WBC: 8.9 10*3/uL (ref 3.6–11.0)

## 2017-08-19 NOTE — Discharge Instructions (Signed)
Call OB-GYN.  Either stop pills altogether or start back on Sunday.  Take care  Dr. Adriana Simasook

## 2017-08-19 NOTE — ED Provider Notes (Signed)
[facility]    CSN: [id number] Arrival date & time: 08/19/17  1700  History   Chief Complaint Chief Complaint  Patient presents with  . Menorrhagia   HPI  42 year old female presents with menorrhagia.  Patient was recently placed on OCPs.  She is been on the birth control for nearly 1 month.  She states she has 2 doses left on this pack of pills.  She states that she has had a menses for 2 weeks.  Has steadily been worsening.  She states that she is on her cycle has been getting heavier and has been passing clots.  She called her OB/GYN and was instructed to be seen for evaluation.  No reports of pain.  She is concerned that her bleeding is too heavy.  She states that she was instructed to take 3 months of birth control for an ovarian cyst.  She is currently on placebo week (no hormone).  No other symptoms.  No other complaints concerns time.  Past Medical History:  Diagnosis Date  . HPV in female   . PCOS (polycystic ovarian syndrome)   . Sickle cell trait [facility](HCC)    Patient Active Problem List   Diagnosis Date Noted  . Abnormal mammogram of right breast 11/21/2015  . Hip pain, chronic 08/06/2015  . Sickle cell trait (HCC) 08/06/2015  . Menstrual irregularity 08/06/2015   Past Surgical History:  Procedure Laterality Date  . CERVICAL BIOPSY  W/ LOOP ELECTRODE EXCISION  1999   abnormal Pap  . HIP SURGERY Right    62mo old   OB History    No data available     Home Medications    Prior to Admission medications   Medication Sig Start Date End Date Taking? Authorizing Provider  Ascorbic Acid (VITA-C PO) Take by mouth.   Yes [provider]  CRANBERRY SOFT PO Take by mouth.   Yes [provider]  Cyanocobalamin (RA VITAMIN B-12 TR) 1000 MCG TBCR Take 1 tablet by mouth daily.   Yes [provider]  fluticasone (FLONASE) 50 MCG/ACT nasal spray Place 2 sprays into both nostrils daily. 06/08/17  Yes Ovid Curdoemer, William P, PA-C  Ginkgo Biloba 40  MG TABS Take 1 tablet by mouth daily.   Yes [provider]  levonorgestrel-ethinyl estradiol (VIENVA) 0.1-20 MG-MCG tablet Take 1 tablet by mouth daily.   Yes [provider]  magnesium oxide (MAG-OX) 400 MG tablet Take 400 mg by mouth daily.   Yes [provider]  pyridOXINE (B-6) 50 MG tablet Take 50 mg by mouth.   Yes [provider]  EPINEPHrine 0.3 mg/0.3 mL IJ SOAJ injection Inject 0.3 mLs (0.3 mg total) into the muscle once. 11/05/15   Reubin MilanBerglund, Laura H, MD    Family History Family History  Problem Relation Age of Onset  . Diabetes Father   . Sickle cell trait Father     Social History Social History   Tobacco Use  . Smoking status: Never Smoker  . Smokeless tobacco: Never Used  Substance Use Topics  . Alcohol use: Yes    Alcohol/week: 0.0 oz    Comment: occasional  . Drug use: No     Allergies   Iodinated diagnostic agents and Iodine   Review of Systems Review of Systems  Constitutional: Negative.   Genitourinary: Positive for menstrual problem.   Physical Exam Triage Vital Signs ED Triage Vitals [08/19/17 1729]  Enc Vitals Group     BP 119/70  Pulse Rate 96     Resp 16     Temp 98.3 F (36.8 C)     Temp Source Oral     SpO2 100 %     Weight 188 lb (85.3 kg)     Height 4\' 11"  (1.499 m)     Head Circumference      Peak Flow      Pain Score 0     Pain Loc      Pain Edu?      Excl. in GC?    Updated Vital Signs BP 119/70 (BP Location: Left Arm)   Pulse 96   Temp 98.3 F (36.8 C) (Oral)   Resp 16   Ht 4\' 11"  (1.499 m)   Wt 188 lb (85.3 kg)   LMP 08/15/2017   SpO2 100%   BMI 37.97 kg/m   Physical Exam  Constitutional: She is oriented to person, place, and time. She appears well-developed and well-nourished. No distress.  Cardiovascular: Normal rate and regular rhythm.  Pulmonary/Chest: Effort normal and breath sounds normal. She has no wheezes. She has no rales.  Abdominal: Soft. She exhibits no  distension. There is no tenderness.  Neurological: She is alert and oriented to person, place, and time.  Psychiatric: She has a normal mood and affect. Her behavior is normal.  Nursing note and vitals reviewed.  UC Treatments / Results  Labs (all labs ordered are listed, but only abnormal results are displayed) Labs Reviewed  CBC    EKG  EKG Interpretation None       Radiology No results found.  Procedures Procedures (including critical care time)  Medications Ordered in UC Medications - No data to display   Initial Impression / Assessment and Plan / UC Course  I have reviewed the triage vital signs and the nursing notes.  Pertinent labs & imaging results that were available during my care of the patient were reviewed by me and considered in my medical decision making (see chart for details).     42 year old female presents with heavy menstrual bleeding.  Advised to either stop OCP altogether or start as she is supposed to on Sunday.  I anticipate that if she starts back on Sunday bleeding will taper off as she is back on hormone therapy.  I also advised her to call her OB/GYN and discuss.  CBC normal.  Final Clinical Impressions(s) / UC Diagnoses   Final diagnoses:  Menorrhagia with irregular cycle    ED Discharge Orders    None     Controlled Substance Prescriptions [place] Controlled Substance Registry consulted? Not Applicable   Tommie Sams, [place] 08/19/17 1610

## 2017-08-19 NOTE — ED Triage Notes (Signed)
Patient in today c/o heavy menses. Patient was recently  started on OCP for ovarian cyst. Patient was on her second 2 weeks of the pills and started spotting. Patient now reports her menses has continued and has gotten heavier with clots.

## 2017-08-22 ENCOUNTER — Telehealth: Payer: Self-pay | Admitting: *Deleted

## 2017-08-22 NOTE — Telephone Encounter (Signed)
Called patient, verified DOB, patient reported feeling better. Advised patient to follow up with PCP if symptoms persist. 

## 2018-01-20 ENCOUNTER — Encounter: Payer: Self-pay | Admitting: Emergency Medicine

## 2018-01-20 ENCOUNTER — Other Ambulatory Visit: Payer: Self-pay

## 2018-01-20 ENCOUNTER — Ambulatory Visit
Admission: EM | Admit: 2018-01-20 | Discharge: 2018-01-20 | Disposition: A | Discharge status: Home / Self Care | Payer: Managed Care, Other (non HMO) | Attending: Family Medicine | Admitting: Family Medicine

## 2018-01-20 DIAGNOSIS — M545 Low back pain, unspecified: Secondary | ICD-10-CM

## 2018-01-20 DIAGNOSIS — N898 Other specified noninflammatory disorders of vagina: Secondary | ICD-10-CM

## 2018-01-20 DIAGNOSIS — J029 Acute pharyngitis, unspecified: Secondary | ICD-10-CM | POA: Diagnosis not present

## 2018-01-20 DIAGNOSIS — B9689 Other specified bacterial agents as the cause of diseases classified elsewhere: Secondary | ICD-10-CM

## 2018-01-20 DIAGNOSIS — N390 Urinary tract infection, site not specified: Secondary | ICD-10-CM | POA: Diagnosis not present

## 2018-01-20 DIAGNOSIS — R0981 Nasal congestion: Secondary | ICD-10-CM | POA: Diagnosis not present

## 2018-01-20 DIAGNOSIS — N760 Acute vaginitis: Secondary | ICD-10-CM | POA: Diagnosis not present

## 2018-01-20 DIAGNOSIS — R350 Frequency of micturition: Secondary | ICD-10-CM | POA: Diagnosis not present

## 2018-01-20 LAB — URINALYSIS, COMPLETE (UACMP) WITH MICROSCOPIC
BILIRUBIN URINE: NEGATIVE
Glucose, UA: NEGATIVE mg/dL
Hgb urine dipstick: NEGATIVE
KETONES UR: NEGATIVE mg/dL
NITRITE: POSITIVE — AB
Protein, ur: NEGATIVE mg/dL
RBC / HPF: NONE SEEN RBC/hpf (ref 0–5)
Specific Gravity, Urine: 1.015 (ref 1.005–1.030)
pH: 6.5 (ref 5.0–8.0)

## 2018-01-20 LAB — RAPID STREP SCREEN (MED CTR MEBANE ONLY): STREPTOCOCCUS, GROUP A SCREEN (DIRECT): NEGATIVE

## 2018-01-20 LAB — WET PREP, GENITAL
Sperm: NONE SEEN
TRICH WET PREP: NONE SEEN
YEAST WET PREP: NONE SEEN

## 2018-01-20 MED ORDER — CEPHALEXIN 500 MG PO CAPS: 500.0000 mg | ORAL_CAPSULE | Freq: Two times a day (BID) | ORAL | 0 refills | Status: AC

## 2018-01-20 MED ORDER — METRONIDAZOLE 500 MG PO TABS: 500.0000 mg | ORAL_TABLET | Freq: Two times a day (BID) | ORAL | 0 refills | Status: DC

## 2018-01-20 NOTE — ED Triage Notes (Signed)
Patient in today c/o sore throat, congestion, fatigue x 1 week. Patient denies fever. Patient's son and husband have been diagnosed with strep throat.

## 2018-01-20 NOTE — ED Provider Notes (Signed)
[facility] ____________________________________________  Time seen: Approximately 7:28 PM  I have reviewed the triage vital signs and the nursing notes.   HISTORY  Chief Complaint Sore Throat   HPI Monica Robbins is a 42 y.o. female presenting for evaluation of multiple medical complaints.  Patient states initially runny nose, nasal congestion, sore throat present for the past week with overall feeling more tired.  Patient also then reports some right low back pain as well as some vaginal discharge and urinary frequency.  His vaginal discharge today was a color that had some odor to it.  States back pain is been intermittent for several days.  States urinary vaginal is been present for 1 to 2 days.  Declines STD testing.  Patient states that her husband and her son have strep throat currently and she is concerned of strep throat.  Denies known fevers.  Has continued to eat and drink well.  States has been staying very busy this week.  No over-the-counter medications taken except for supplements and vitamins.  Denies other aggravating or alleviating factors.  States mild sore throat currently. Denies chest pain, shortness of breath, abdominal pain, extremity pain, extremity swelling or rash. Denies recent sickness. Denies recent antibiotic use.   Reubin Milan, MD: PCP   Past Medical History:  Diagnosis Date  . HPV in female   . PCOS (polycystic ovarian syndrome)   . Sickle cell trait [facility])     Patient Active Problem List   Diagnosis Date Noted  . Abnormal mammogram of right breast 11/21/2015  . Hip pain, chronic 08/06/2015  . Sickle cell trait (HCC) 08/06/2015  . Menstrual irregularity 08/06/2015    Past Surgical History:  Procedure Laterality Date  . CERVICAL BIOPSY  W/ LOOP ELECTRODE EXCISION  1999   abnormal Pap  . HIP SURGERY Right    20mo old     No current facility-administered medications for this encounter.   Current Outpatient Medications:  .   Ascorbic Acid (VITA-C PO), Take by mouth., Disp: , Rfl:  .  cholecalciferol (VITAMIN D) 1000 units tablet, Take 1,000 Units by mouth daily., Disp: , Rfl:  .  Cyanocobalamin (RA VITAMIN B-12 TR) 1000 MCG TBCR, Take 1 tablet by mouth daily., Disp: , Rfl:  .  fluticasone (FLONASE) 50 MCG/ACT nasal spray, Place 2 sprays into both nostrils daily., Disp: 16 g, Rfl: 0 .  Ginkgo Biloba 40 MG TABS, Take 1 tablet by mouth daily., Disp: , Rfl:  .  magnesium oxide (MAG-OX) 400 MG tablet, Take 400 mg by mouth daily., Disp: , Rfl:  .  pyridOXINE (B-6) 50 MG tablet, Take 50 mg by mouth., Disp: , Rfl:  .  cephALEXin (KEFLEX) 500 MG capsule, Take 1 capsule (500 mg total) by mouth 2 (two) times daily for 7 days., Disp: 14 capsule, Rfl: 0 .  EPINEPHrine 0.3 mg/0.3 mL IJ SOAJ injection, Inject 0.3 mLs (0.3 mg total) into the muscle once., Disp: 2 Device, Rfl: 1 .  metroNIDAZOLE (FLAGYL) 500 MG tablet, Take 1 tablet (500 mg total) by mouth 2 (two) times daily., Disp: 14 tablet, Rfl: 0  Allergies Iodinated diagnostic agents and Iodine  Family History  Problem Relation Age of Onset  . Arthritis Mother   . Diabetes Father   . Sickle cell trait Father     Social History Social History   Tobacco Use  . Smoking status: Never Smoker  . Smokeless tobacco: Never Used  Substance Use Topics  . Alcohol use: Yes  Alcohol/week: 0.0 oz    Comment: occasional  . Drug use: No    Review of Systems Constitutional: No fever Eyes: No visual changes. ENT: Positive sore throat. Cardiovascular: Denies chest pain. Respiratory: Denies shortness of breath. Gastrointestinal: No abdominal pain.  No nausea, no vomiting.  No diarrhea.  No constipation. Genitourinary: As above.  Musculoskeletal:As above.  Skin: Negative for rash.   ____________________________________________   PHYSICAL EXAM:  VITAL SIGNS: ED Triage Vitals  Enc Vitals Group     BP 01/20/18 1842 97/70     Pulse Rate 01/20/18 1842 94     Resp  01/20/18 1842 16     Temp 01/20/18 1842 97.9 F (36.6 C)     Temp Source 01/20/18 1842 Oral     SpO2 01/20/18 1842 100 %     Weight 01/20/18 1843 193 lb 12.8 oz (87.9 kg)     Height 01/20/18 1843 4\' 11"  (1.499 m)     Head Circumference --      Peak Flow --      Pain Score 01/20/18 1841 5     Pain Loc --      Pain Edu? --      Excl. in GC? --     Constitutional: Alert and oriented. Well appearing and in no acute distress. Eyes: Conjunctivae are normal.  Head: Atraumatic.Mild tenderness to palpation bilateral frontal and maxillary sinuses. No swelling. No erythema.   Ears: no erythema, normal TMs bilaterally.   Nose: nasal congestion with bilateral nasal turbinate erythema and edema.   Mouth/Throat: Mucous membranes are moist.  Oropharynx non-erythematous.No tonsillar swelling or exudate.  Neck: No stridor.  No cervical spine tenderness to palpation. Hematological/Lymphatic/Immunilogical: No cervical lymphadenopathy. Cardiovascular: Normal rate, regular rhythm. Grossly normal heart sounds.  Good peripheral circulation. Respiratory: Normal respiratory effort.  No retractions. No wheezes, rales or rhonchi. Good air movement.  Gastrointestinal: Soft and nontender. No distention. Normal Bowel sounds. No CVA tenderness. Musculoskeletal: Steady gait. No cervical, thoracic or lumbar tenderness to palpation.  Mild tenderness to direct palpation right lower paralumbar tenderness, no CVA tenderness. Neurologic:  Normal speech and language. No gross focal neurologic deficits are appreciated. No gait instability. Skin:  Skin is warm, dry and intact. No rash noted. Psychiatric: Mood and affect are normal. Speech and behavior are normal.  ___________________________________________   LABS (all labs ordered are listed, but only abnormal results are displayed)  Labs Reviewed  WET PREP, GENITAL - Abnormal; Notable for the following components:      Result Value   Clue Cells Wet Prep HPF POC  PRESENT (*)    WBC, Wet Prep HPF POC FEW (*)    All other components within normal limits  URINALYSIS, COMPLETE (UACMP) WITH MICROSCOPIC - Abnormal; Notable for the following components:   APPearance HAZY (*)    Nitrite POSITIVE (*)    Leukocytes, UA TRACE (*)    Bacteria, UA MANY (*)    All other components within normal limits  RAPID STREP SCREEN ([facility] ONLY)  CULTURE, GROUP A STREP ([facility])  URINE CULTURE    ____________________________________________   PROCEDURES Procedures    INITIAL IMPRESSION / ASSESSMENT AND PLAN / ED COURSE  Pertinent labs & imaging results that were available during my care of the patient were reviewed by me and considered in my medical decision making (see chart for details).  Well-appearing patient.  No acute distress.  Quick strep negative, will culture.  Discussed in detail with patient will evaluate urine also  recommend wet prep, patient elects for self wet prep and declined pelvic.  Declined STD testing.  Urinalysis reviewed, suspect UTI as well as positive for bacterial vaginosis.  Will culture urine.   suspect recent viral illness as well as positive UTI and bacterial vaginosis.  Will treat with oral Keflex and Flagyl.  Also discussed right lower back pain present with twisting, suspect strain injury, and encourage supportive care and stretching and close monitoring.  No fevers or CVA tenderness.  Encourage rest, fluids, supportive care.  Discussed strict follow-up and return parameters.  Work note given for today and tomorrow.Discussed indication, risks and benefits of medications with patient.  Discussed follow up with Primary care physician this week. Discussed follow up and return parameters including no resolution or any worsening concerns. Patient verbalized understanding and agreed to plan.   ____________________________________________   FINAL CLINICAL IMPRESSION(S) / ED DIAGNOSES  Final diagnoses:  Urinary tract infection without  hematuria, site unspecified  Bacterial vaginosis  Pharyngitis, unspecified etiology  Nasal congestion  Acute right-sided low back pain without sciatica     ED Discharge Orders        Ordered    cephALEXin (KEFLEX) 500 MG capsule  2 times daily     01/20/18 2002    metroNIDAZOLE (FLAGYL) 500 MG tablet  2 times daily     01/20/18 2002       Note: This dictation was prepared with Dragon dictation along with smaller phrase technology. Any transcriptional errors that result from this process are unintentional.         Renford Dills, NP 01/20/18 2025

## 2018-01-20 NOTE — Discharge Instructions (Addendum)
Take medication as prescribed. Rest. Drink plenty of fluids.  ° °Follow up with your primary care physician this week as needed. Return to Urgent care for new or worsening concerns.  ° °

## 2018-01-23 ENCOUNTER — Telehealth (HOSPITAL_COMMUNITY): Payer: Self-pay

## 2018-01-23 LAB — CULTURE, GROUP A STREP (THRC)

## 2018-01-23 LAB — URINE CULTURE: Culture: >=100000 — AB

## 2018-01-23 NOTE — Telephone Encounter (Signed)
Urine culture positive for Klebsiella this was treated with keflex at urgent care visit.  Patient called and made aware.

## 2018-03-21 ENCOUNTER — Ambulatory Visit
Admission: EM | Admit: 2018-03-21 | Discharge: 2018-03-21 | Disposition: A | Discharge status: Home / Self Care | Payer: Managed Care, Other (non HMO) | Attending: Family Medicine | Admitting: Family Medicine

## 2018-03-21 ENCOUNTER — Other Ambulatory Visit: Payer: Self-pay

## 2018-03-21 ENCOUNTER — Encounter: Payer: Self-pay | Admitting: Emergency Medicine

## 2018-03-21 DIAGNOSIS — R1031 Right lower quadrant pain: Secondary | ICD-10-CM

## 2018-03-21 DIAGNOSIS — R1011 Right upper quadrant pain: Secondary | ICD-10-CM | POA: Diagnosis not present

## 2018-03-21 DIAGNOSIS — R5383 Other fatigue: Secondary | ICD-10-CM | POA: Diagnosis not present

## 2018-03-21 DIAGNOSIS — Z3202 Encounter for pregnancy test, result negative: Secondary | ICD-10-CM

## 2018-03-21 LAB — COMPREHENSIVE METABOLIC PANEL
ALT: 12 U/L (ref 0–44)
AST: 13 U/L — ABNORMAL LOW (ref 15–41)
Albumin: 3.6 g/dL (ref 3.5–5.0)
Alkaline Phosphatase: 62 U/L (ref 38–126)
Anion gap: 10 (ref 5–15)
BUN: 13 mg/dL (ref 6–20)
CO2: 25 mmol/L (ref 22–32)
Calcium: 8.6 mg/dL — ABNORMAL LOW (ref 8.9–10.3)
Chloride: 101 mmol/L (ref 98–111)
Creatinine, Ser: 0.73 mg/dL (ref 0.44–1.00)
GFR calc Af Amer: >60 mL/min (ref >60)
GFR calc non Af Amer: >60 mL/min (ref >60)
Glucose, Bld: 98 mg/dL (ref 70–99)
Potassium: 3.4 mmol/L — ABNORMAL LOW (ref 3.5–5.1)
Sodium: 136 mmol/L (ref 135–145)
Total Bilirubin: 0.1 mg/dL — ABNORMAL LOW (ref 0.3–1.2)
Total Protein: 7.4 g/dL (ref 6.5–8.1)

## 2018-03-21 LAB — CBC WITH DIFFERENTIAL/PLATELET
Basophils Absolute: 0.1 10*3/uL (ref 0–0.1)
Basophils Relative: 1 %
Eosinophils Absolute: 0.1 10*3/uL (ref 0–0.7)
Eosinophils Relative: 1 %
HCT: 38.1 % (ref 35.0–47.0)
Hemoglobin: 13 g/dL (ref 12.0–16.0)
Lymphocytes Relative: 17 %
Lymphs Abs: 1.8 10*3/uL (ref 1.0–3.6)
MCH: 29.1 pg (ref 26.0–34.0)
MCHC: 34.1 g/dL (ref 32.0–36.0)
MCV: 85.4 fL (ref 80.0–100.0)
Monocytes Absolute: 0.6 10*3/uL (ref 0.2–0.9)
Monocytes Relative: 6 %
Neutro Abs: 7.6 10*3/uL — ABNORMAL HIGH (ref 1.4–6.5)
Neutrophils Relative %: 75 %
Platelets: 273 10*3/uL (ref 150–440)
RBC: 4.46 MIL/uL (ref 3.80–5.20)
RDW: 14 % (ref 11.5–14.5)
WBC: 10.2 10*3/uL (ref 3.6–11.0)

## 2018-03-21 LAB — URINALYSIS, COMPLETE (UACMP) WITH MICROSCOPIC
BILIRUBIN URINE: NEGATIVE
GLUCOSE, UA: NEGATIVE mg/dL
HGB URINE DIPSTICK: NEGATIVE
KETONES UR: NEGATIVE mg/dL
Leukocytes, UA: NEGATIVE
NITRITE: NEGATIVE
PROTEIN: NEGATIVE mg/dL
Specific Gravity, Urine: 1.015 (ref 1.005–1.030)
pH: 5.5 (ref 5.0–8.0)

## 2018-03-21 LAB — LIPASE, BLOOD: Lipase: 22 U/L (ref 11–51)

## 2018-03-21 LAB — PREGNANCY, URINE: Preg Test, Ur: NEGATIVE

## 2018-03-21 MED ORDER — NAPROXEN 500 MG PO TABS: 500.0000 mg | ORAL_TABLET | Freq: Two times a day (BID) | ORAL | 0 refills | Status: DC

## 2018-03-21 MED ORDER — KETOROLAC TROMETHAMINE 60 MG/2ML IM SOLN
60.0000 mg | Freq: Once | INTRAMUSCULAR | Status: AC
  Administered 2018-03-21: 60 mg via INTRAMUSCULAR

## 2018-03-21 MED ORDER — CEFUROXIME AXETIL 250 MG PO TABS: ORAL_TABLET | ORAL | 0 refills | Status: DC

## 2018-03-21 NOTE — ED Provider Notes (Signed)
[facility]    CSN: [id number] Arrival date & time: 03/21/18  1257     History   Chief Complaint Chief Complaint  Patient presents with  . Abdominal Pain  . Flank Pain    HPI Monica Robbins is a 42 y.o. female.   HPI  42 year old female presents with right upper quadrant abdominal pain radiate into her abdominal RLQ.  She has had dry mouth, fatigue and constipationt/diarrhea.  Her symptoms started about a week ago but worsened yesterday.  Last a bowel movement this morning was small amounts firm.  Has had nausea but no vomiting.  She has noticed urinary symptoms of intermittent stopping of her stream and then a pulsating of the urine till emptying.  She denies any vaginal discharge.  Is not sleeping well which may account for her fatigue.  No fever or chills.          Past Medical History:  Diagnosis Date  . HPV in female   . PCOS (polycystic ovarian syndrome)   . Sickle cell trait [facility])     Patient Active Problem List   Diagnosis Date Noted  . Abnormal mammogram of right breast 11/21/2015  . Hip pain, chronic 08/06/2015  . Sickle cell trait (HCC) 08/06/2015  . Menstrual irregularity 08/06/2015    Past Surgical History:  Procedure Laterality Date  . CERVICAL BIOPSY  W/ LOOP ELECTRODE EXCISION  1999   abnormal Pap  . HIP SURGERY Right    15mo old    OB History   None      Home Medications    Prior to Admission medications   Medication Sig Start Date End Date Taking? Authorizing Provider  Ascorbic Acid (VITA-C PO) Take by mouth.   Yes [provider]  cholecalciferol (VITAMIN D) 1000 units tablet Take 1,000 Units by mouth daily.   Yes [provider]  Cyanocobalamin (RA VITAMIN B-12 TR) 1000 MCG TBCR Take 1 tablet by mouth daily.   Yes [provider]  EPINEPHrine 0.3 mg/0.3 mL IJ SOAJ injection Inject 0.3 mLs (0.3 mg total) into the muscle once. 11/05/15  Yes Reubin Milan, MD  Ginkgo Biloba 40 MG TABS Take 1  tablet by mouth daily.   Yes [provider]  magnesium oxide (MAG-OX) 400 MG tablet Take 400 mg by mouth daily.   Yes [provider]  pyridOXINE (B-6) 50 MG tablet Take 50 mg by mouth.   Yes [provider]  VIENVA 0.1-20 MG-MCG tablet TK 1 T PO D 03/12/18  Yes [provider]  cefUROXime (CEFTIN) 250 MG tablet Take one tablet BID with food 03/21/18   Lutricia Feil, PA-C  naproxen (NAPROSYN) 500 MG tablet Take 1 tablet (500 mg total) by mouth 2 (two) times daily with a meal. 03/21/18   Lutricia Feil, PA-C    Family History Family History  Problem Relation Age of Onset  . Arthritis Mother   . Diabetes Father   . Sickle cell trait Father     Social History Social History   Tobacco Use  . Smoking status: Never Smoker  . Smokeless tobacco: Never Used  Substance Use Topics  . Alcohol use: Yes    Alcohol/week: 0.0 standard drinks    Comment: occasional  . Drug use: No     Allergies   Iodinated diagnostic agents and Iodine   Review of Systems Review of Systems  Constitutional: Positive for activity change, appetite change and fatigue. Negative for chills and fever.  Gastrointestinal: Positive for abdominal distention, abdominal pain, constipation, diarrhea and nausea. Negative for vomiting.  All other systems reviewed and are negative.    Physical Exam Triage Vital Signs ED Triage Vitals  Enc Vitals Group     BP 03/21/18 1343 106/61     Pulse Rate 03/21/18 1343 87     Resp 03/21/18 1343 16     Temp 03/21/18 1343 98.1 F (36.7 C)     Temp Source 03/21/18 1343 Oral     SpO2 03/21/18 1343 99 %     Weight 03/21/18 1341 193 lb (87.5 kg)     Height 03/21/18 1341 4\' 11"  (1.499 m)     Head Circumference --      Peak Flow --      Pain Score 03/21/18 1340 5     Pain Loc --      Pain Edu? --      Excl. in GC? --    No data found.  Updated Vital Signs BP 106/61 (BP Location: Right Arm)   Pulse 87   Temp 98.1 F (36.7 C) (Oral)    Resp 16   Ht 4\' 11"  (1.499 m)   Wt 193 lb (87.5 kg)   LMP  (Within Months)   SpO2 99%   BMI 38.98 kg/m   Visual Acuity Right Eye Distance:   Left Eye Distance:   Bilateral Distance:    Right Eye Near:   Left Eye Near:    Bilateral Near:     Physical Exam  Constitutional: She is oriented to person, place, and time. She appears well-developed and well-nourished.  Non-toxic appearance. She does not appear ill. No distress.  HENT:  Head: Normocephalic.  Eyes: Pupils are equal, round, and reactive to light.  Abdominal: Soft. Bowel sounds are decreased. There is tenderness in the right upper quadrant. There is CVA tenderness and positive Murphy's sign. There is no rigidity, no rebound and no guarding.  Neurological: She is alert and oriented to person, place, and time.  Skin: Skin is warm and dry.  Psychiatric: She has a normal mood and affect. Her behavior is normal.  Nursing note and vitals reviewed.    UC Treatments / Results  Labs (all labs ordered are listed, but only abnormal results are displayed) Labs Reviewed  URINALYSIS, COMPLETE (UACMP) WITH MICROSCOPIC - Abnormal; Notable for the following components:      Result Value   Color, Urine STRAW (*)    Bacteria, UA MANY (*)    All other components within normal limits  COMPREHENSIVE METABOLIC PANEL - Abnormal; Notable for the following components:   Potassium 3.4 (*)    Calcium 8.6 (*)    AST 13 (*)    Total Bilirubin 0.1 (*)    All other components within normal limits  CBC WITH DIFFERENTIAL/PLATELET - Abnormal; Notable for the following components:   Neutro Abs 7.6 (*)    All other components within normal limits  URINE CULTURE  PREGNANCY, URINE  LIPASE, BLOOD    EKG None  Radiology No results found.  Procedures Procedures (including critical care time)  Medications Ordered in UC Medications  ketorolac (TORADOL) injection 60 mg (60 mg Intramuscular Given 03/21/18 1449)    Initial Impression /  Assessment and Plan / UC Course  I have reviewed the triage vital signs and the nursing notes.  Pertinent labs & imaging results that were available during my care of the patient were reviewed by me and considered in my medical decision  making (see chart for details).     Plan: 1. Test/x-ray results and diagnosis reviewed with patient 2. rx as per orders; risks, benefits, potential side effects reviewed with patient 3. Recommend supportive treatment with use of the antibiotic and Naprosyn for pain.  Nothing by mouth after midnight for your ultrasound study in the morning.  We will contact you with the results of the study as well as the next step.  Commend following up with your primary care physician next week.  If your symptoms worsen or have any change go to the emergency room immediately. 4. F/u prn if symptoms worsen or don't improve  Final Clinical Impressions(s) / UC Diagnoses   Final diagnoses:  RUQ pain     Discharge Instructions     Pain worsens or is accompanied by nausea vomiting or fever go to the emergency room.  Nothing by mouth after midnight for your ultrasound tomorrow morning.    ED Prescriptions    Medication Sig Dispense Auth. Provider   cefUROXime (CEFTIN) 250 MG tablet Take one tablet BID with food 20 tablet Ovid Curd P, PA-C   naproxen (NAPROSYN) 500 MG tablet Take 1 tablet (500 mg total) by mouth 2 (two) times daily with a meal. 60 tablet Lutricia Feil, PA-C     Controlled Substance Prescriptions [place] Controlled Substance Registry consulted? Not Applicable   Lutricia Feil, PA-C 03/21/18 1542

## 2018-03-21 NOTE — ED Notes (Signed)
US Abdomen scheduled for 8am at [facility] for tomorrow morning.

## 2018-03-21 NOTE — ED Triage Notes (Signed)
C/o abdominal pain, and back pain, dry mouth, fatigue, and constipation/diarrhea. Started about a week ago but worse yesterday.

## 2018-03-21 NOTE — Discharge Instructions (Signed)
Pain worsens or is accompanied by nausea vomiting or fever go to the emergency room.  Nothing by mouth after midnight for your ultrasound tomorrow morning.

## 2018-03-22 ENCOUNTER — Telehealth: Payer: Self-pay | Admitting: Emergency Medicine

## 2018-03-22 ENCOUNTER — Other Ambulatory Visit: Payer: Self-pay | Admitting: Emergency Medicine

## 2018-03-22 ENCOUNTER — Ambulatory Visit
Admit: 2018-03-22 | Discharge: 2018-03-22 | Disposition: A | Discharge status: Home / Self Care | Payer: Managed Care, Other (non HMO) | Attending: Emergency Medicine | Admitting: Emergency Medicine

## 2018-03-22 DIAGNOSIS — R1011 Right upper quadrant pain: Secondary | ICD-10-CM | POA: Insufficient documentation

## 2018-03-24 LAB — URINE CULTURE: Culture: >=100000 — AB

## 2018-03-28 ENCOUNTER — Telehealth (HOSPITAL_COMMUNITY): Payer: Self-pay

## 2018-03-28 NOTE — Telephone Encounter (Signed)
Urine culture positive for E.coli this was treated with Ceftin per Amy PA. Patient called and made aware.

## 2020-03-20 ENCOUNTER — Ambulatory Visit
Admission: EM | Admit: 2020-03-20 | Discharge: 2020-03-20 | Disposition: A | Discharge status: Home / Self Care | Payer: Managed Care, Other (non HMO) | Attending: Family Medicine | Admitting: Family Medicine

## 2020-03-20 ENCOUNTER — Other Ambulatory Visit: Payer: Self-pay

## 2020-03-20 DIAGNOSIS — R0789 Other chest pain: Secondary | ICD-10-CM

## 2020-03-20 MED ORDER — TIZANIDINE HCL 4 MG PO TABS: 4.0000 mg | ORAL_TABLET | Freq: Three times a day (TID) | ORAL | 0 refills | Status: AC | PRN

## 2020-03-20 NOTE — ED Triage Notes (Signed)
Patient complains of right sided chest pain that started this morning. States that it radiates down her right shoulder and arm. States that she feels like "someone is pinching my breast."

## 2020-03-20 NOTE — Discharge Instructions (Signed)
Tylenol 1000 mg three times daily for pain.  Zanaflex as directed.  Rest. Ice/Heat.  Take care  Dr. Adriana Simas

## 2020-03-20 NOTE — ED Provider Notes (Signed)
[facility]    CSN: [id number] Arrival date & time: 03/20/20  0913      History   Chief Complaint Chief Complaint  Patient presents with  . Chest Pain   HPI  44 year old female presents with right-sided chest pain.  Started abruptly this morning.  She reports that she was rolling over and reached for her glasses and felt a sharp pain on the right side of her chest.  Chest wall is tender.  Pain is severe, 9/10 in severity.  Worse with range of motion.  No relieving factors.  No left-sided chest pain.  No shortness of breath, jaw pain, arm pain.  No reports of diaphoresis.  No other associated symptoms.  No other complaints  Past Medical History:  Diagnosis Date  . HPV in female   . PCOS (polycystic ovarian syndrome)   . Sickle cell trait [facility])    Patient Active Problem List   Diagnosis Date Noted  . Abnormal mammogram of right breast 11/21/2015  . Hip pain, chronic 08/06/2015  . Sickle cell trait (HCC) 08/06/2015  . Menstrual irregularity 08/06/2015   Past Surgical History:  Procedure Laterality Date  . CERVICAL BIOPSY  W/ LOOP ELECTRODE EXCISION  1999   abnormal Pap  . HIP SURGERY Right    72mo old   OB History   No obstetric history on file.    Home Medications    Prior to Admission medications   Medication Sig Start Date End Date Taking? Authorizing Provider  cholecalciferol (VITAMIN D) 1000 units tablet Take 1,000 Units by mouth daily.   Yes [provider]  Cyanocobalamin (RA VITAMIN B-12 TR) 1000 MCG TBCR Take 1 tablet by mouth daily.   Yes [provider]  EPINEPHrine 0.3 mg/0.3 mL IJ SOAJ injection Inject 0.3 mLs (0.3 mg total) into the muscle once. 11/05/15  Yes Reubin Milan, MD  tiZANidine (ZANAFLEX) 4 MG tablet Take 1 tablet (4 mg total) by mouth every 8 (eight) hours as needed for muscle spasms. 03/20/20   Tommie Sams, DO    Family History Family History  Problem Relation Age of Onset  . Arthritis Mother   .  Diabetes Father   . Sickle cell trait Father     Social History Social History   Tobacco Use  . Smoking status: Never Smoker  . Smokeless tobacco: Never Used  Vaping Use  . Vaping Use: Never used  Substance Use Topics  . Alcohol use: Yes    Alcohol/week: 0.0 standard drinks    Comment: occasional  . Drug use: No     Allergies   Iodinated diagnostic agents and Iodine   Review of Systems Review of Systems  Constitutional: Negative.   Cardiovascular: Positive for chest pain.   Physical Exam Triage Vital Signs ED Triage Vitals  Enc Vitals Group     BP 03/20/20 0918 133/80     Pulse Rate 03/20/20 0918 77     Resp 03/20/20 0918 18     Temp 03/20/20 0918 98 F (36.7 C)     Temp Source 03/20/20 0918 Oral     SpO2 03/20/20 0918 100 %     Weight 03/20/20 0916 185 lb (83.9 kg)     Height 03/20/20 0916 4' 11.75" (1.518 m)     Head Circumference --      Peak Flow --      Pain Score 03/20/20 0916 9     Pain Loc --      Pain  Edu? --      Excl. in GC? --    Updated Vital Signs BP 133/80 (BP Location: Left Arm)   Pulse 77   Temp 98 F (36.7 C) (Oral)   Resp 18   Ht 4' 11.75" (1.518 m)   Wt 83.9 kg   LMP 03/08/2020   SpO2 100%   BMI 36.43 kg/m   Visual Acuity Right Eye Distance:   Left Eye Distance:   Bilateral Distance:    Right Eye Near:   Left Eye Near:    Bilateral Near:     Physical Exam Vitals and nursing note reviewed.  Constitutional:      General: She is not in acute distress.    Appearance: Normal appearance. She is not ill-appearing.  HENT:     Head: Normocephalic and atraumatic.  Eyes:     General:        Right eye: No discharge.        Left eye: No discharge.     Conjunctiva/sclera: Conjunctivae normal.  Cardiovascular:     Rate and Rhythm: Normal rate and regular rhythm.  Pulmonary:     Effort: Pulmonary effort is normal.     Breath sounds: Normal breath sounds. No wheezing, rhonchi or rales.  Neurological:     Mental Status: She is  alert.  Psychiatric:        Mood and Affect: Mood normal.        Behavior: Behavior normal.    UC Treatments / Results  Labs (all labs ordered are listed, but only abnormal results are displayed) Labs Reviewed - No data to display  EKG Interpretation: Normal sinus rhythm at the rate of 74.  Normal axis.  Normal intervals.  No ST-T wave changes.  Normal EKG.  Radiology No results found.  Procedures Procedures (including critical care time)  Medications Ordered in UC Medications - No data to display  Initial Impression / Assessment and Plan / UC Course  I have reviewed the triage vital signs and the nursing notes.  Pertinent labs & imaging results that were available during my care of the patient were reviewed by me and considered in my medical decision making (see chart for details).    44 year old female presents with chest wall pain.  This is musculoskeletal in origin.  Tylenol as directed.  She has been informed that she should avoid anti-inflammatories.  Zanaflex as directed.  Work note given.  Supportive care.  Final Clinical Impressions(s) / UC Diagnoses   Final diagnoses:  Chest wall pain     Discharge Instructions     Tylenol 1000 mg three times daily for pain.  Zanaflex as directed.  Rest. Ice/Heat.  Take care  Dr. Adriana Simas     ED Prescriptions    Medication Sig Dispense Auth. Provider   tiZANidine (ZANAFLEX) 4 MG tablet Take 1 tablet (4 mg total) by mouth every 8 (eight) hours as needed for muscle spasms. 30 tablet Tommie Sams, DO     PDMP not reviewed this encounter.   Tommie Sams, [place] 03/20/20 1041

## 2020-10-23 ENCOUNTER — Encounter: Payer: Self-pay | Admitting: Emergency Medicine

## 2020-10-23 ENCOUNTER — Other Ambulatory Visit: Payer: Self-pay

## 2020-10-23 ENCOUNTER — Ambulatory Visit
Admission: EM | Admit: 2020-10-23 | Discharge: 2020-10-23 | Disposition: A | Discharge status: Home / Self Care | Payer: Managed Care, Other (non HMO) | Attending: Sports Medicine | Admitting: Sports Medicine

## 2020-10-23 DIAGNOSIS — R110 Nausea: Secondary | ICD-10-CM | POA: Diagnosis present

## 2020-10-23 DIAGNOSIS — R140 Abdominal distension (gaseous): Secondary | ICD-10-CM | POA: Diagnosis present

## 2020-10-23 DIAGNOSIS — R1011 Right upper quadrant pain: Secondary | ICD-10-CM | POA: Insufficient documentation

## 2020-10-23 LAB — URINALYSIS, COMPLETE (UACMP) WITH MICROSCOPIC
Bilirubin Urine: NEGATIVE
Glucose, UA: NEGATIVE mg/dL
Hgb urine dipstick: NEGATIVE
Ketones, ur: NEGATIVE mg/dL
Nitrite: NEGATIVE
Protein, ur: NEGATIVE mg/dL
RBC / HPF: NONE SEEN RBC/hpf (ref 0–5)
Specific Gravity, Urine: 1.01 (ref 1.005–1.030)
pH: 6 (ref 5.0–8.0)

## 2020-10-23 LAB — CBC WITH DIFFERENTIAL/PLATELET
Abs Immature Granulocytes: 0.02 10*3/uL (ref 0.00–0.07)
Basophils Absolute: 0 10*3/uL (ref 0.0–0.1)
Basophils Relative: 1 %
Eosinophils Absolute: 0.5 10*3/uL (ref 0.0–0.5)
Eosinophils Relative: 6 %
HCT: 40.9 % (ref 36.0–46.0)
Hemoglobin: 14 g/dL (ref 12.0–15.0)
Immature Granulocytes: 0 %
Lymphocytes Relative: 27 %
Lymphs Abs: 2.2 10*3/uL (ref 0.7–4.0)
MCH: 28.2 pg (ref 26.0–34.0)
MCHC: 34.2 g/dL (ref 30.0–36.0)
MCV: 82.5 fL (ref 80.0–100.0)
Monocytes Absolute: 0.5 10*3/uL (ref 0.1–1.0)
Monocytes Relative: 6 %
Neutro Abs: 4.9 10*3/uL (ref 1.7–7.7)
Neutrophils Relative %: 60 %
Platelets: 294 10*3/uL (ref 150–400)
RBC: 4.96 MIL/uL (ref 3.87–5.11)
RDW: 13 % (ref 11.5–15.5)
WBC: 8.1 10*3/uL (ref 4.0–10.5)
nRBC: 0 % (ref 0.0–0.2)

## 2020-10-23 LAB — COMPREHENSIVE METABOLIC PANEL
ALT: 16 U/L (ref 0–44)
AST: 17 U/L (ref 15–41)
Albumin: 3.8 g/dL (ref 3.5–5.0)
Alkaline Phosphatase: 91 U/L (ref 38–126)
Anion gap: 3 — ABNORMAL LOW (ref 5–15)
BUN: 8 mg/dL (ref 6–20)
CO2: 29 mmol/L (ref 22–32)
Calcium: 8.8 mg/dL — ABNORMAL LOW (ref 8.9–10.3)
Chloride: 102 mmol/L (ref 98–111)
Creatinine, Ser: 0.63 mg/dL (ref 0.44–1.00)
GFR, Estimated: >60 mL/min (ref >60)
Glucose, Bld: 97 mg/dL (ref 70–99)
Potassium: 4.2 mmol/L (ref 3.5–5.1)
Sodium: 134 mmol/L — ABNORMAL LOW (ref 135–145)
Total Bilirubin: 0.3 mg/dL (ref 0.3–1.2)
Total Protein: 6.8 g/dL (ref 6.5–8.1)

## 2020-10-23 LAB — LIPASE, BLOOD: Lipase: 30 U/L (ref 11–51)

## 2020-10-23 LAB — PREGNANCY, URINE: Preg Test, Ur: NEGATIVE

## 2020-10-23 MED ORDER — ONDANSETRON HCL 4 MG PO TABS: 4.0000 mg | ORAL_TABLET | Freq: Four times a day (QID) | ORAL | 0 refills | Status: AC

## 2020-10-23 NOTE — ED Provider Notes (Signed)
[facility]    CSN: [id number] Arrival date & time: 10/23/20  1249      History   Chief Complaint Chief Complaint  Patient presents with  . Abdominal Pain    HPI Deandrea Vanpelt is a 45 y.o. female.   Is a pleasant 45 year old female who presents for evaluation the above issue.  Her primary care physician is in [place] and she can get an appointment.  She works over at [employer].  She reports right upper quadrant pain that began this morning.  She did have a lap chole done back 2 years ago at [facility].  She says the pain feels the same as it did when she had her issues with her gallbladder.  Symptoms seem to be worse after eating.  She feels bloated.  She denies any urinary symptoms.  No hematuria.  She has associated nausea.  She does have some chronic loose stools after her gallbladder removal.  No blood in the stool.  No emesis.  She denies chest pain shortness of breath.  She also denies any reflux symptoms or reflux history.  She has been vaccinated against COVID x2.  No booster.  No flu shot.  No COVID exposure or COVID history.  She has had no issues with her pancreas in the past.  Occasional alcohol use.  Not daily.  She denies chest pain shortness of breath.  No red flag signs or symptoms elicited on history.     Past Medical History:  Diagnosis Date  . HPV in female   . PCOS (polycystic ovarian syndrome)   . Sickle cell trait [facility])     Patient Active Problem List   Diagnosis Date Noted  . Abnormal mammogram of right breast 11/21/2015  . Hip pain, chronic 08/06/2015  . Sickle cell trait (HCC) 08/06/2015  . Menstrual irregularity 08/06/2015    Past Surgical History:  Procedure Laterality Date  . CERVICAL BIOPSY  W/ LOOP ELECTRODE EXCISION  1999   abnormal Pap  . HIP SURGERY Right    87mo old    OB History   No obstetric history on file.      Home Medications    Prior to Admission medications   Medication Sig Start Date End Date Taking? Authorizing  Provider  cholecalciferol (VITAMIN D) 1000 units tablet Take 1,000 Units by mouth daily.   Yes [provider]  Cyanocobalamin 1000 MCG TBCR Take 1 tablet by mouth daily.   Yes [provider]  ondansetron (ZOFRAN) 4 MG tablet Take 1 tablet (4 mg total) by mouth every 6 (six) hours. 10/23/20  Yes Delton See, MD  EPINEPHrine 0.3 mg/0.3 mL IJ SOAJ injection Inject 0.3 mLs (0.3 mg total) into the muscle once. 11/05/15   Reubin Milan, MD  tiZANidine (ZANAFLEX) 4 MG tablet Take 1 tablet (4 mg total) by mouth every 8 (eight) hours as needed for muscle spasms. 03/20/20   Tommie Sams, DO    Family History Family History  Problem Relation Age of Onset  . Arthritis Mother   . Diabetes Father   . Sickle cell trait Father     Social History Social History   Tobacco Use  . Smoking status: Never Smoker  . Smokeless tobacco: Never Used  Vaping Use  . Vaping Use: Never used  Substance Use Topics  . Alcohol use: Yes    Alcohol/week: 0.0 standard drinks    Comment: occasional  . Drug use: No     Allergies  Iodinated diagnostic agents and Iodine   Review of Systems Review of Systems  Constitutional: Negative for chills, diaphoresis and fever.  HENT: Negative.  Negative for congestion.   Eyes: Negative.  Negative for pain.  Respiratory: Negative.  Negative for cough, chest tightness, shortness of breath, wheezing and stridor.   Cardiovascular: Negative for chest pain and palpitations.  Gastrointestinal: Positive for abdominal pain. Negative for blood in stool, constipation, diarrhea, nausea and vomiting.  Genitourinary: Negative for dysuria, flank pain, frequency, hematuria, pelvic pain and urgency.  Musculoskeletal: Negative.  Negative for arthralgias, back pain, gait problem, joint swelling, myalgias, neck pain and neck stiffness.  Skin: Negative.  Negative for color change, pallor, rash and wound.  Neurological: Negative for dizziness, light-headedness,  numbness and headaches.  All other systems reviewed and are negative.    Physical Exam Triage Vital Signs ED Triage Vitals  Enc Vitals Group     BP 10/23/20 1300 113/79     Pulse Rate 10/23/20 1300 74     Resp 10/23/20 1300 18     Temp 10/23/20 1300 98.2 F (36.8 C)     Temp Source 10/23/20 1300 Oral     SpO2 10/23/20 1300 99 %     Weight 10/23/20 1257 193 lb (87.5 kg)     Height 10/23/20 1257 4\' 11"  (1.499 m)     Head Circumference --      Peak Flow --      Pain Score 10/23/20 1256 5     Pain Loc --      Pain Edu? --      Excl. in GC? --    No data found.  Updated Vital Signs BP 113/79 (BP Location: Left Arm)   Pulse 74   Temp 98.2 F (36.8 C) (Oral)   Resp 18   Ht 4\' 11"  (1.499 m)   Wt 87.5 kg   LMP 09/08/2020   SpO2 99%   BMI 38.98 kg/m   Visual Acuity Right Eye Distance:   Left Eye Distance:   Bilateral Distance:    Right Eye Near:   Left Eye Near:    Bilateral Near:     Physical Exam Vitals and nursing note reviewed.  Constitutional:      General: She is not in acute distress.    Appearance: She is well-developed. She is not ill-appearing, toxic-appearing or diaphoretic.     Comments: She is uncomfortable appearing.  Has a difficult time with the examination laying flat on the table.  HENT:     Head: Normocephalic and atraumatic.     Mouth/Throat:     Mouth: Mucous membranes are moist.     Pharynx: No pharyngeal swelling or oropharyngeal exudate.  Eyes:     General: No scleral icterus.    Extraocular Movements: Extraocular movements intact.  Cardiovascular:     Rate and Rhythm: Normal rate and regular rhythm.     Heart sounds: Normal heart sounds. No murmur heard. No friction rub. No gallop.   Pulmonary:     Effort: Pulmonary effort is normal.     Breath sounds: Normal breath sounds. No stridor. No wheezing or rales.  Chest:     Chest wall: No tenderness.  Abdominal:     General: Abdomen is scaphoid. Bowel sounds are normal. There is no  distension. There are no signs of injury.     Palpations: Abdomen is soft. There is no shifting dullness, hepatomegaly or splenomegaly.     Tenderness: There is abdominal tenderness in  the right upper quadrant. There is guarding and rebound. There is no right CVA tenderness or left CVA tenderness.  Skin:    General: Skin is warm and dry.     Capillary Refill: Capillary refill takes less than 2 seconds.     Coloration: Skin is not cyanotic, jaundiced, mottled or pale.     Findings: No erythema or rash.  Neurological:     General: No focal deficit present.     Mental Status: She is alert and oriented to person, place, and time.     Cranial Nerves: No cranial nerve deficit.     Motor: No weakness.  Psychiatric:        Mood and Affect: Mood normal.        Behavior: Behavior normal.      UC Treatments / Results  Labs (all labs ordered are listed, but only abnormal results are displayed) Labs Reviewed  URINALYSIS, COMPLETE (UACMP) WITH MICROSCOPIC - Abnormal; Notable for the following components:      Result Value   Leukocytes,Ua TRACE (*)    Bacteria, UA FEW (*)    All other components within normal limits  COMPREHENSIVE METABOLIC PANEL - Abnormal; Notable for the following components:   Sodium 134 (*)    Calcium 8.8 (*)    Anion gap 3 (*)    All other components within normal limits  URINE CULTURE  PREGNANCY, URINE  CBC WITH DIFFERENTIAL/PLATELET  LIPASE, BLOOD    EKG   Radiology No results found.  Procedures Procedures (including critical care time)  Medications Ordered in UC Medications - No data to display  Initial Impression / Assessment and Plan / UC Course  I have reviewed the triage vital signs and the nursing notes.  Pertinent labs & imaging results that were available during my care of the patient were reviewed by me and considered in my medical decision making (see chart for details).  Clinical impression: Right upper quadrant pain with bloating.   Symptoms are worse after eating.  Symptoms are also similar to when she needed her gallbladder removed a few years ago.  She does have some rebound and guarding on examination.  She also has some nausea noted.  Treatment plan: 1.  The findings and treatment plan were discussed in detail with the patient.  Patient was in agreement. 2.  Recommended we get some labs.  Her lipase was normal.  Her CBC was also normal.  UA showed some trace leukocytes and a few bacteria.  No evidence of a significant urinary tract infection.  We will send the culture given the leukocytes.  Her CMP showed a mild decrease in calcium and a mild decrease in sodium. 3.  Given her exam I recommended getting a CT of her abdomen.  She does have a contrast allergy so we will do it without contrast.  Attempt to get this approved by her insurance company were difficult.  It was initially denied.  We will see if we can appeal that.  She may need her primary care provider to see if they can get that approved. 4.  If symptoms were to worsen in any way then she needs to go to the emergency room where she can get the appropriate scan done.  She was in agreement with this plan. 5.  Follow-up with primary care physician if symptoms persist. 6.  Educational handout provided. 7.  Prescribe Zofran for her nausea. 8.  Gave her a work note. 9.  Follow-up here as needed.  She was stable upon discharge.    Final Clinical Impressions(s) / UC Diagnoses   Final diagnoses:  Right upper quadrant abdominal pain  Nausea  Abdominal bloating     Discharge Instructions     You have right upper quadrant pain where your gallbladder used to be.  You may have some dilatation of the ducts in that area.  I recommended getting a CT scan of your abdomen.  Given your contrast allergy will do it without contrast. While we await your insurance to approve it, we will discharge you and you will have it done as an outpatient. I went ahead and gave you some  medicine for nausea. I also gave you a work note saying you were seen today. Once I receive the results I will call you.  When you have your test done you can leave the CT department.  I hope you get to feeling better, Dr. Zachery DauerBarnes    ED Prescriptions    Medication Sig Dispense Auth. Provider   ondansetron (ZOFRAN) 4 MG tablet Take 1 tablet (4 mg total) by mouth every 6 (six) hours. 12 tablet Delton SeeBarnes, Krisann Mckenna, MD     PDMP not reviewed this encounter.   Delton SeeBarnes, Navraj Dreibelbis, MD 10/23/20 [phone]

## 2020-10-23 NOTE — ED Notes (Signed)
Pt CT was denied by insurance.

## 2020-10-23 NOTE — Discharge Instructions (Signed)
You have right upper quadrant pain where your gallbladder used to be.  You may have some dilatation of the ducts in that area.  I recommended getting a CT scan of your abdomen.  Given your contrast allergy will do it without contrast. While we await your insurance to approve it, we will discharge you and you will have it done as an outpatient. I went ahead and gave you some medicine for nausea. I also gave you a work note saying you were seen today. Once I receive the results I will call you.  When you have your test done you can leave the CT department.  I hope you get to feeling better, Dr. Zachery Dauer

## 2020-10-23 NOTE — ED Triage Notes (Signed)
Patient c/o upper right side abdomen pain that started today. She reports nausea, denies vomiting and diarrhea.

## 2020-10-26 LAB — URINE CULTURE: Culture: 60000 — AB

## 2020-10-27 ENCOUNTER — Telehealth (HOSPITAL_COMMUNITY): Payer: Self-pay | Admitting: Emergency Medicine

## 2020-10-27 MED ORDER — NITROFURANTOIN MONOHYD MACRO 100 MG PO CAPS: 100.0000 mg | ORAL_CAPSULE | Freq: Two times a day (BID) | ORAL | 0 refills | Status: DC

## 2021-05-31 ENCOUNTER — Encounter: Payer: Self-pay | Admitting: Emergency Medicine

## 2021-05-31 ENCOUNTER — Ambulatory Visit (INDEPENDENT_AMBULATORY_CARE_PROVIDER_SITE_OTHER): Payer: Worker's Compensation

## 2021-05-31 ENCOUNTER — Ambulatory Visit
Admission: EM | Admit: 2021-05-31 | Discharge: 2021-05-31 | Disposition: A | Discharge status: Home / Self Care | Payer: Worker's Compensation | Attending: Physician Assistant | Admitting: Physician Assistant

## 2021-05-31 ENCOUNTER — Other Ambulatory Visit: Payer: Self-pay

## 2021-05-31 DIAGNOSIS — Y990 Civilian activity done for income or pay: Secondary | ICD-10-CM | POA: Diagnosis not present

## 2021-05-31 DIAGNOSIS — W19XXXA Unspecified fall, initial encounter: Secondary | ICD-10-CM | POA: Diagnosis not present

## 2021-05-31 DIAGNOSIS — M25561 Pain in right knee: Secondary | ICD-10-CM | POA: Diagnosis not present

## 2021-05-31 DIAGNOSIS — R0782 Intercostal pain: Secondary | ICD-10-CM | POA: Diagnosis not present

## 2021-05-31 DIAGNOSIS — M25551 Pain in right hip: Secondary | ICD-10-CM

## 2021-05-31 DIAGNOSIS — R0781 Pleurodynia: Secondary | ICD-10-CM

## 2021-05-31 MED ORDER — TRAMADOL HCL 50 MG PO TABS: 50.0000 mg | ORAL_TABLET | Freq: Four times a day (QID) | ORAL | 0 refills | Status: AC | PRN

## 2021-05-31 MED ORDER — MELOXICAM 15 MG PO TABS: 15.0000 mg | ORAL_TABLET | Freq: Every day | ORAL | 0 refills | Status: AC

## 2021-05-31 NOTE — Discharge Instructions (Signed)
-  Your x-rays do not show any fractures.  You do have severe arthritis in your hip and arthritis in your knee.  I know you are already aware of the severe arthritis in her hip.  I have refilled the meloxicam for you and given you a very short supply of pain medication.  Continue with your muscle relaxer to as needed.  Rest and ice.  You will need to contact the number below for a follow-up related to work comp condition.  You will need to be cleared before going back to work.  I can only give you a couple of days off.  Follow up with Joanna Hews, NP [address] [place], [place] [address]  Call to make appointment at [phone]  Clinic hours: 8a-5p M-F

## 2021-05-31 NOTE — ED Provider Notes (Signed)
[facility]    CSN: [id number] Arrival date & time: 05/31/21  1159      History   Chief Complaint Chief Complaint  Patient presents with   Worker's Comp Injury   Fall   Knee Pain   Other    Rib Pain     HPI Monica Robbins is a 45 y.o. female presenting for multiple injuries due to a work accident yesterday.  She says that she was walking and on her leg caught on a cart.  She says she then fell onto her right side.  Patient says she already has trouble with her right hip and is expecting to have a hip replacement.  She is concerned because now she has more pain than she did before.  Additionally she reports pain of her right knee and an abrasion as well as right-sided rib pain.  No shortness of breath or difficulty breathing associated with that.  She denies ever having a head injury or losing consciousness.  Has been taking anti-inflammatory medicine and icing her knee without improvement in her symptoms.  Admits to pain when she tries to get up and walk and put weight on her knee/hip.  Patient does not report any other injuries.  HPI  Past Medical History:  Diagnosis Date   HPV in female    PCOS (polycystic ovarian syndrome)    Sickle cell trait [facility])     Patient Active Problem List   Diagnosis Date Noted   Abnormal mammogram of right breast 11/21/2015   Hip pain, chronic 08/06/2015   Sickle cell trait (HCC) 08/06/2015   Menstrual irregularity 08/06/2015    Past Surgical History:  Procedure Laterality Date   CERVICAL BIOPSY  W/ LOOP ELECTRODE EXCISION  1999   abnormal Pap   HIP SURGERY Right    18mo old    OB History   No obstetric history on file.      Home Medications    Prior to Admission medications   Medication Sig Start Date End Date Taking? Authorizing Provider  cholecalciferol (VITAMIN D) 1000 units tablet Take 1,000 Units by mouth daily.   Yes [provider]  Cyanocobalamin 1000 MCG TBCR Take 1 tablet by mouth daily.   Yes  [provider]  meloxicam (MOBIC) 15 MG tablet Take 1 tablet (15 mg total) by mouth daily for 15 days. 05/31/21 06/15/21 Yes Shirlee Latch, PA-C  traMADol (ULTRAM) 50 MG tablet Take 1 tablet (50 mg total) by mouth every 6 (six) hours as needed for up to 3 days. 05/31/21 06/03/21 Yes Eusebio Friendly B, PA-C  EPINEPHrine 0.3 mg/0.3 mL IJ SOAJ injection Inject 0.3 mLs (0.3 mg total) into the muscle once. 11/05/15   Reubin Milan, MD  ondansetron (ZOFRAN) 4 MG tablet Take 1 tablet (4 mg total) by mouth every 6 (six) hours. 10/23/20   Delton See, MD  tiZANidine (ZANAFLEX) 4 MG tablet Take 1 tablet (4 mg total) by mouth every 8 (eight) hours as needed for muscle spasms. 03/20/20   Tommie Sams, DO    Family History Family History  Problem Relation Age of Onset   Arthritis Mother    Diabetes Father    Sickle cell trait Father     Social History Social History   Tobacco Use   Smoking status: Never   Smokeless tobacco: Never  Vaping Use   Vaping Use: Never used  Substance Use Topics   Alcohol use: Yes    Alcohol/week: 0.0 standard drinks  Comment: occasional   Drug use: No     Allergies   Almond oil, Celery (apium graveolens var. dulce) skin test, Cyclobenzaprine, Iodinated diagnostic agents, Iodine, Peanut-containing drug products, and Corylus   Review of Systems Review of Systems  Constitutional:  Negative for fatigue.  Respiratory:  Negative for cough and shortness of breath.   Cardiovascular:  Negative for chest pain.  Musculoskeletal:  Positive for arthralgias, gait problem and joint swelling. Negative for back pain, neck pain and neck stiffness.  Skin:  Positive for wound. Negative for color change.  Neurological:  Negative for weakness and numbness.    Physical Exam Triage Vital Signs ED Triage Vitals  Enc Vitals Group     BP 05/31/21 1227 115/71     Pulse Rate 05/31/21 1227 86     Resp 05/31/21 1227 14     Temp 05/31/21 1227 97.8 F (36.6 C)      Temp Source 05/31/21 1227 Oral     SpO2 05/31/21 1227 100 %     Weight 05/31/21 1223 179 lb (81.2 kg)     Height 05/31/21 1223 4\' 11"  (1.499 m)     Head Circumference --      Peak Flow --      Pain Score 05/31/21 1223 6     Pain Loc --      Pain Edu? --      Excl. in GC? --    No data found.  Updated Vital Signs BP 115/71 (BP Location: Right Arm)   Pulse 86   Temp 97.8 F (36.6 C) (Oral)   Resp 14   Ht 4\' 11"  (1.499 m)   Wt 179 lb (81.2 kg)   LMP 04/30/2021 (Approximate)   SpO2 100%   BMI 36.15 kg/m    Physical Exam Vitals and nursing note reviewed.  Constitutional:      General: She is not in acute distress.    Appearance: Normal appearance. She is not ill-appearing or toxic-appearing.  HENT:     Head: Normocephalic and atraumatic.     Nose: Nose normal.     Mouth/Throat:     Mouth: Mucous membranes are moist.     Pharynx: Oropharynx is clear.  Eyes:     General: No scleral icterus.       Right eye: No discharge.        Left eye: No discharge.     Conjunctiva/sclera: Conjunctivae normal.     Pupils: Pupils are equal, round, and reactive to light.  Cardiovascular:     Rate and Rhythm: Normal rate and regular rhythm.     Heart sounds: Normal heart sounds.  Pulmonary:     Effort: Pulmonary effort is normal. No respiratory distress.     Breath sounds: Normal breath sounds. No wheezing, rhonchi or rales.  Musculoskeletal:     Cervical back: Neck supple.     Comments: RIGHT KNEE: Small abrasion in the anterior knee.  Tenderness to palpation in this area and of anterior joint line.  Range of motion reduced to 90 degrees of flexion.  Cannot fully extend knee.  No instability of the knee.  Good strength.  RIGHT HIP: Patient exhibits tenderness palpation of the lateral and anterior hip.  Pain with any movement of the hip so range of motion is limited.  Difficult to perform a complete exam due to her level of discomfort.  RIGHT RIBS: Tenderness palpation of the ribs 5  through 9.  No step-offs noted.  Chest clear to  auscultation.  Skin:    General: Skin is dry.  Neurological:     General: No focal deficit present.     Mental Status: She is alert. Mental status is at baseline.     Motor: No weakness.     Gait: Gait normal.  Psychiatric:        Mood and Affect: Mood normal.        Behavior: Behavior normal.        Thought Content: Thought content normal.     UC Treatments / Results  Labs (all labs ordered are listed, but only abnormal results are displayed) Labs Reviewed - No data to display  EKG   Radiology DG Ribs Unilateral W/Chest Right  Result Date: 05/31/2021 CLINICAL DATA:  Fall at work EXAM: RIGHT RIBS AND CHEST - 3+ VIEW COMPARISON:  None FINDINGS: The cardiomediastinal silhouette is within normal limits. There is no focal airspace disease. No pleural effusion or pneumothorax. There is no evidence of displaced rib fracture or other acute bony abnormality. IMPRESSION: No evidence of displaced rib fracture. No acute cardiopulmonary disease. Electronically Signed   By: Caprice Renshaw M.D.   On: 05/31/2021 13:43   DG Knee Complete 4 Views Right  Result Date: 05/31/2021 CLINICAL DATA:  Fall at work EXAM: RIGHT KNEE - COMPLETE 4+ VIEW COMPARISON:  None. FINDINGS: There is no evidence of acute fracture. There is mild medial predominant osteoarthritis of the right knee. No significant joint effusion. IMPRESSION: No evidence of acute fracture or joint effusion. Mild medial compartment osteoarthritis. Electronically Signed   By: Caprice Renshaw M.D.   On: 05/31/2021 13:44   DG Hip Unilat W or Wo Pelvis 2-3 Views Right  Result Date: 05/31/2021 CLINICAL DATA:  Fall at work EXAM: DG HIP (WITH OR WITHOUT PELVIS) 2-3V RIGHT COMPARISON:  MRI 04/08/2016, radiograph 03/18/2016 FINDINGS: There is severe right hip osteoarthritis with femoroacetabular remodeling, significant subchondral sclerosis and cystic change. This has progressed since the prior exam in 2017.  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 The left hip is unremarkable on frontal view. There is no evidence of acute fracture. IMPRESSION: No evidence of acute fracture. Severe right hip osteoarthritis with femoroacetabular remodeling, progressed since the prior exam in 2017. Electronically Signed   By: Caprice Renshaw M.D.   On: 05/31/2021 13:47    Procedures Procedures (including critical care time)  Medications Ordered in UC Medications - No data to display  Initial Impression / Assessment and Plan / UC Course  I have reviewed the triage vital signs and the nursing notes.  Pertinent labs & imaging results that were available during my care of the patient were reviewed by me and considered in my medical decision making (see chart for details).  45 year old female presenting for work-related injuries that she sustained yesterday.  X-rays of right ribs/chest, right knee and right hip all obtained.  X-rays are reviewed.  Results show no acute fractures.  She does have severe arthritis of her hip and mild arthritis of her knee.  Discussed these x-ray results with patient.  Patient believes she cannot return to work at this time because she says she has to stand a lot and lift over 50 pounds at a time.  Advised patient I can give her a couple of days off but then she will need to follow-up with work comp to get clearance before returning to work.  At this time I have refilled her meloxicam.  Also patient has been given a very  short supply of Ultram which she can take when she is at home on her days off.  Reviewed RICE guidelines.  Advise going to emergency department for any acute worsening of her pain.   Final Clinical Impressions(s) / UC Diagnoses   Final diagnoses:  Acute pain of right knee  Right hip pain  Rib pain on right side  Fall, initial encounter  Work related injury     Discharge Instructions      -Your x-rays do not show any fractures.  You do have severe  arthritis in your hip and arthritis in your knee.  I know you are already aware of the severe arthritis in her hip.  I have refilled the meloxicam for you and given you a very short supply of pain medication.  Continue with your muscle relaxer to as needed.  Rest and ice.  You will need to contact the number below for a follow-up related to work comp condition.  You will need to be cleared before going back to work.  I can only give you a couple of days off.  Follow up with Joanna Hews, NP [address] [place], [place] [address]  Call to make appointment at [phone]  Clinic hours: 8a-5p M-F      ED Prescriptions     Medication Sig Dispense Auth. Provider   meloxicam (MOBIC) 15 MG tablet Take 1 tablet (15 mg total) by mouth daily for 15 days. 15 tablet Eusebio Friendly B, PA-C   traMADol (ULTRAM) 50 MG tablet Take 1 tablet (50 mg total) by mouth every 6 (six) hours as needed for up to 3 days. 8 tablet Shirlee Latch, PA-C      I have reviewed the PDMP during this encounter.   Shirlee Latch, PA-C 05/31/21 [phone]

## 2021-05-31 NOTE — ED Triage Notes (Addendum)
Patient states that at work yesterday her pant leg got caught in the loading cart and she fell and landed on her right side.  Patient c/o right side rib pain and right knee pain.

## 2023-03-09 IMAGING — CR DG RIBS W/ CHEST 3+V*R*
3 series · 3 of 3 positions shown · non-contrast
Comparison: None

CLINICAL DATA: Fall at work

EXAM:
RIGHT RIBS AND CHEST - 3+ VIEW

[chest pa]
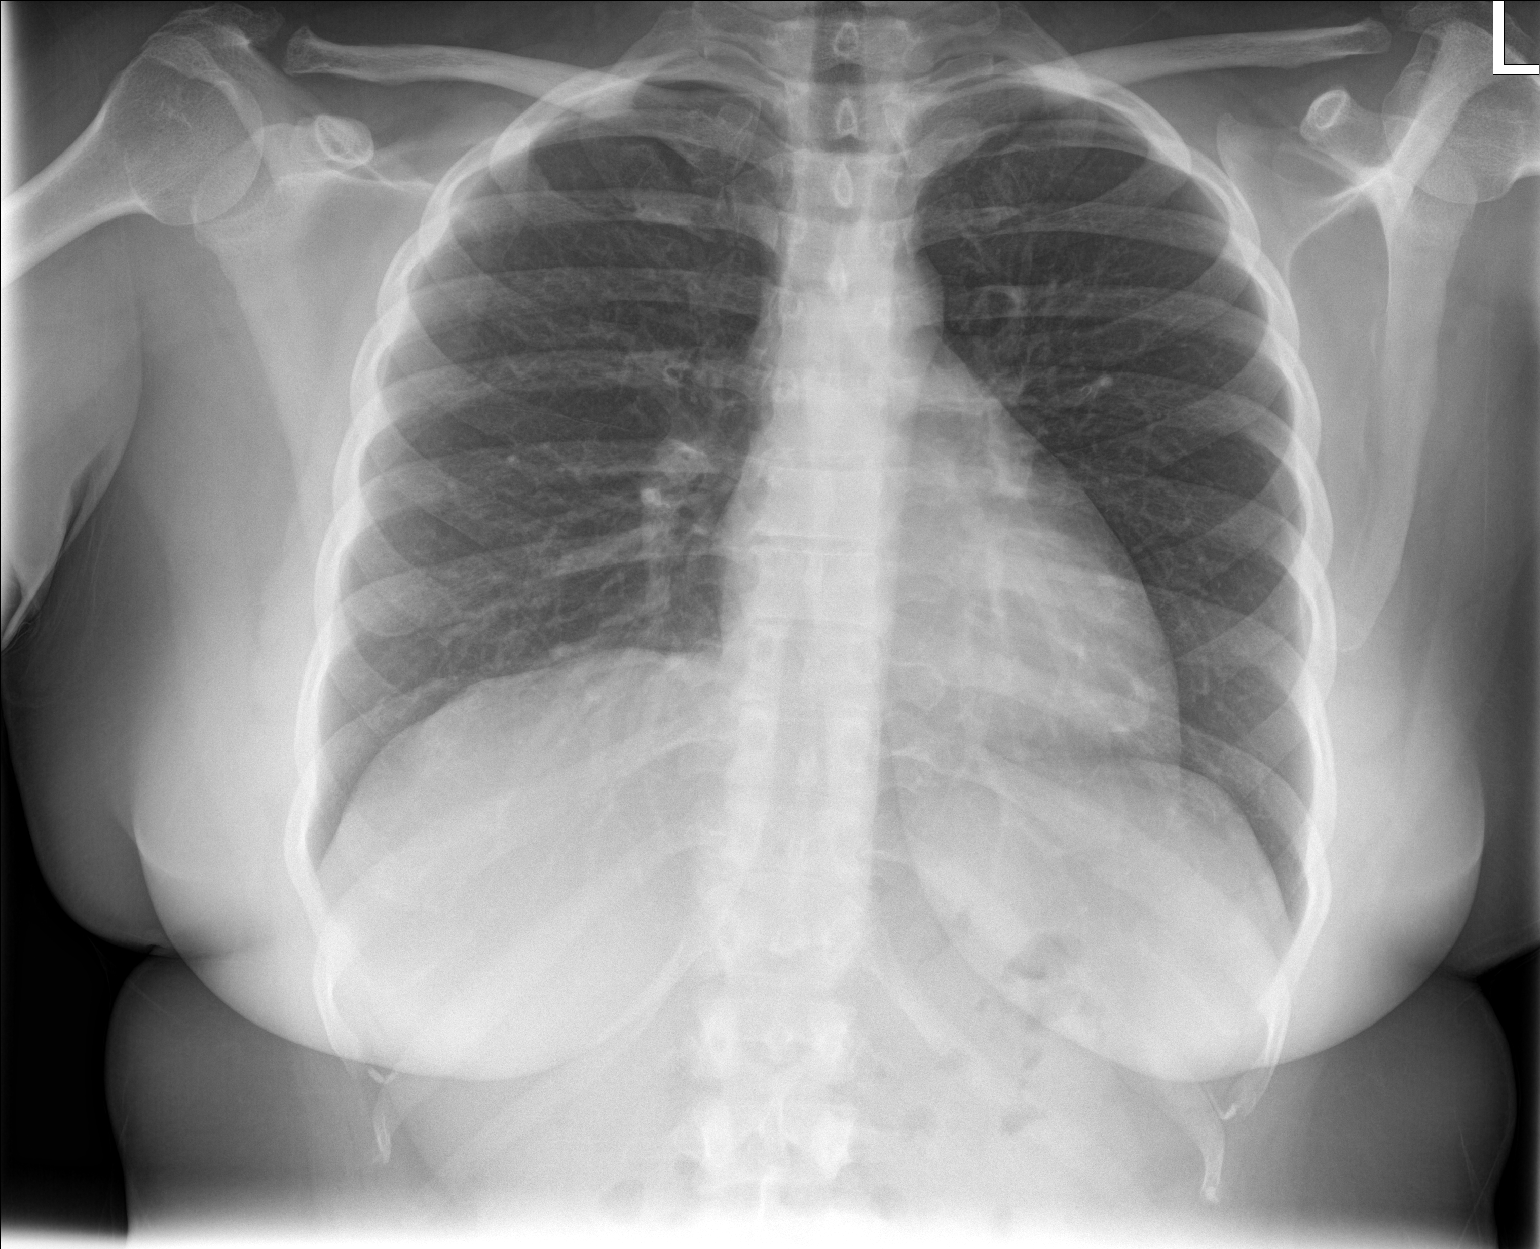

[rib pa]
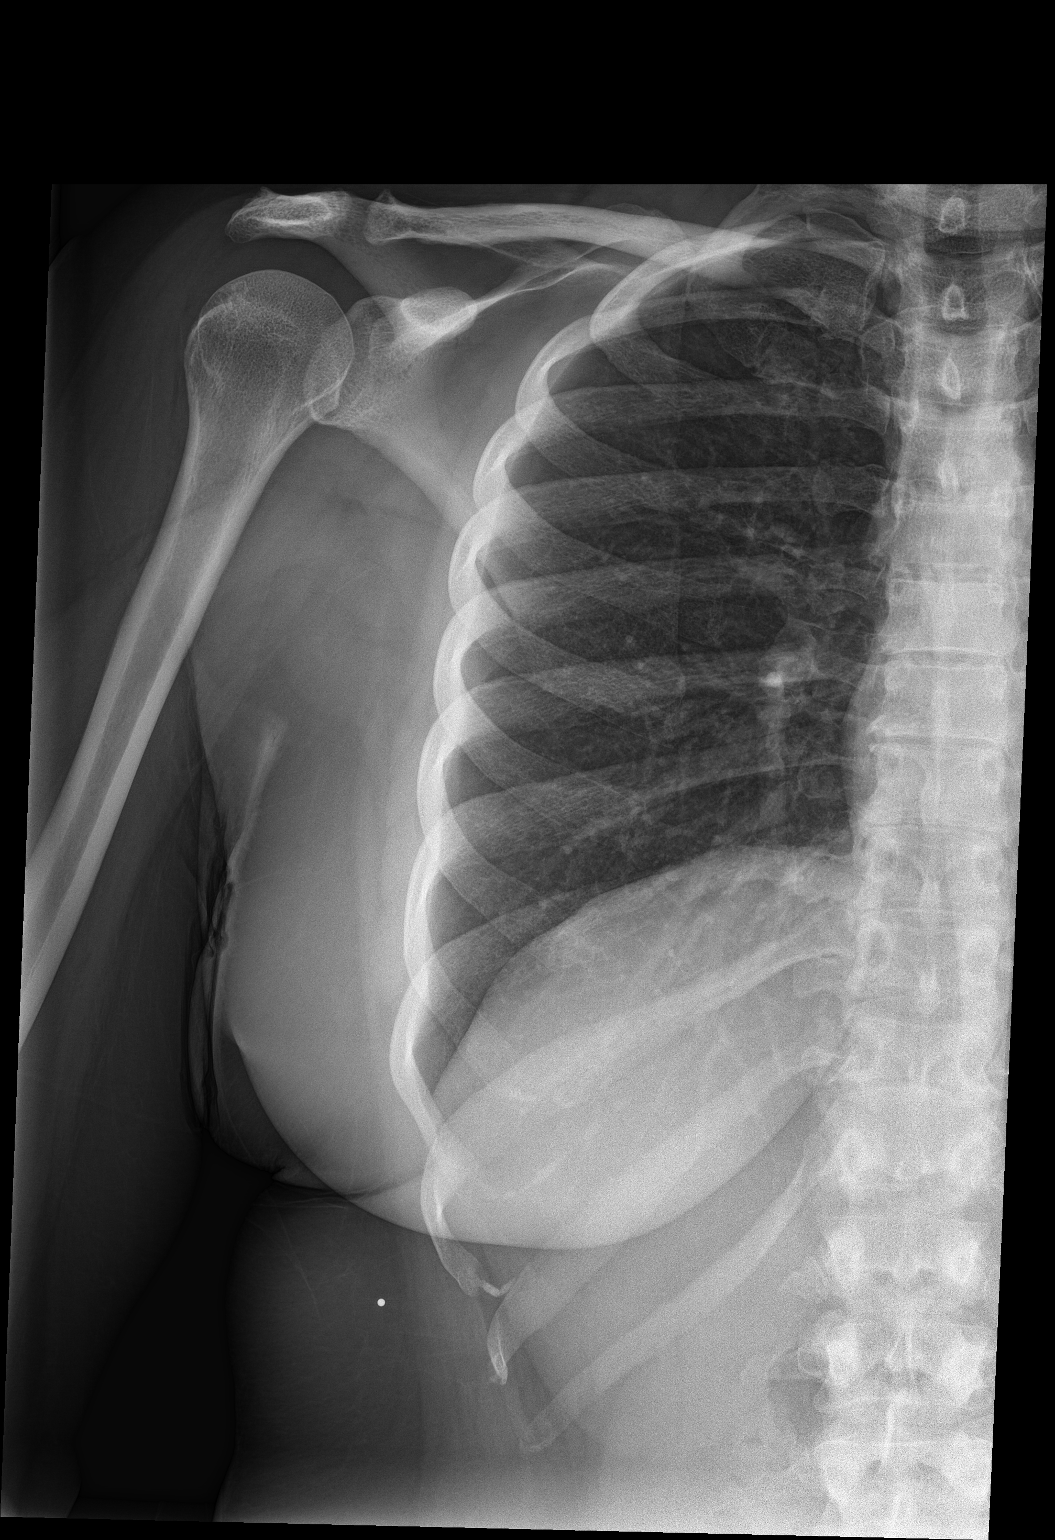

[rib obl]
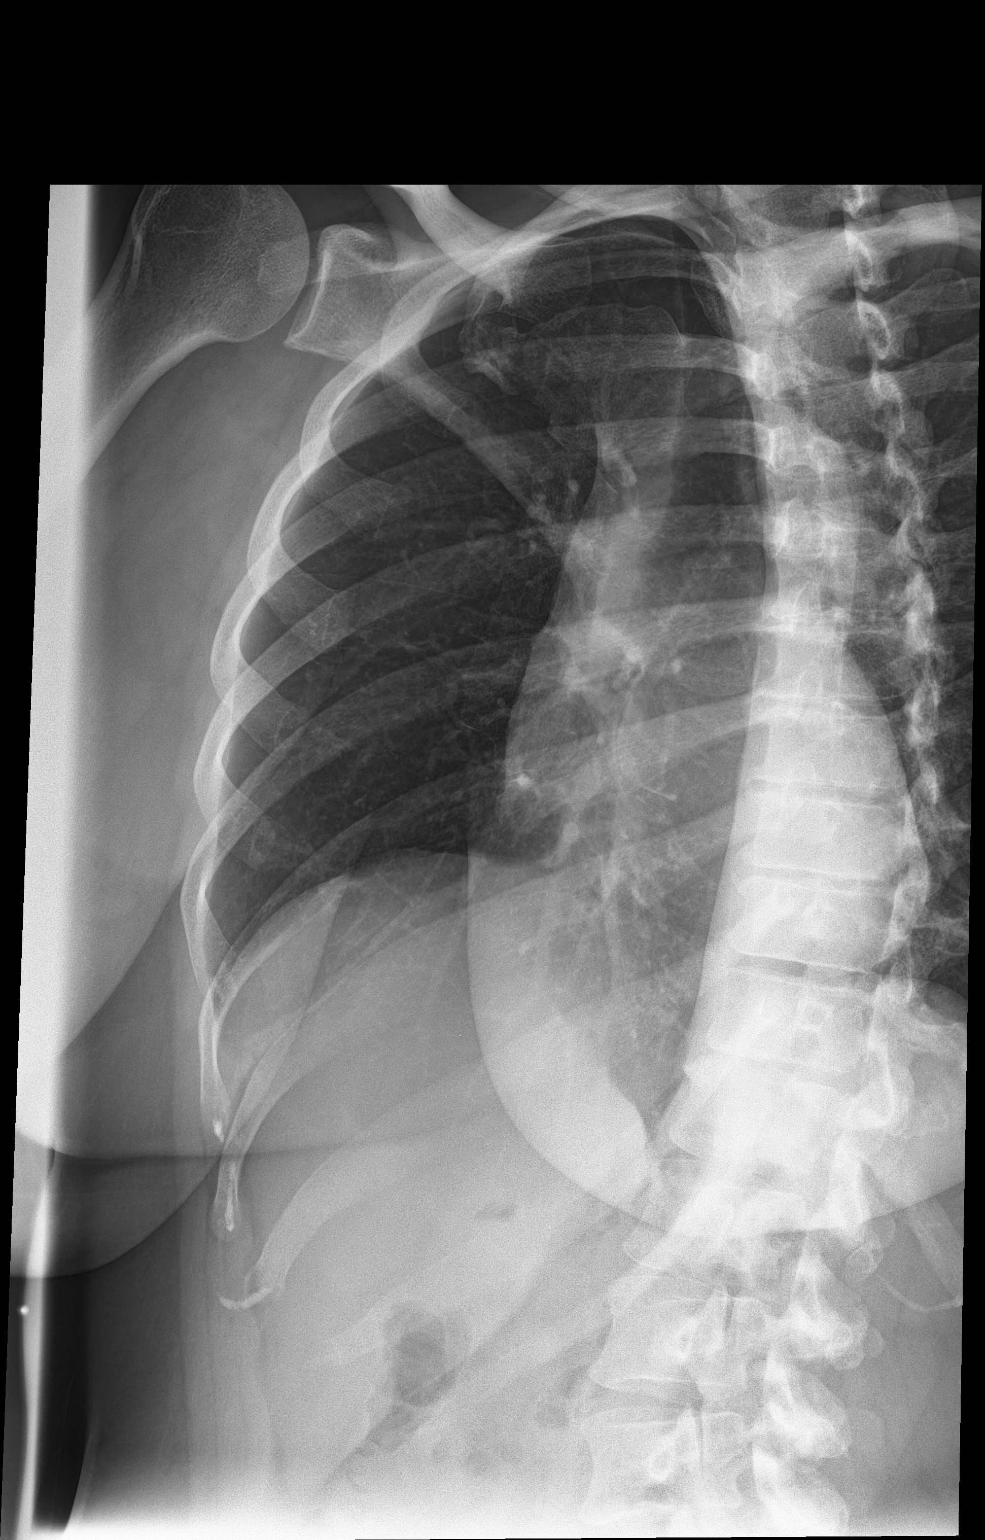

[3 of 3 positions shown; findings below may reference images not displayed]

FINDINGS: The cardiomediastinal silhouette is within normal limits. There is
no focal airspace disease. No pleural effusion or pneumothorax.

There is no evidence of displaced rib fracture or other acute bony
abnormality.
IMPRESSION: No evidence of displaced rib fracture. No acute cardiopulmonary
disease.

## 2023-03-09 IMAGING — CR DG HIP (WITH OR WITHOUT PELVIS) 2-3V*R*
3 series · 3 of 3 positions shown · non-contrast
Comparison: MRI 04/08/2016, radiograph 03/18/2016

CLINICAL DATA: Fall at work

EXAM:
DG HIP (WITH OR WITHOUT PELVIS) 2-3V RIGHT

[pelvis ap]
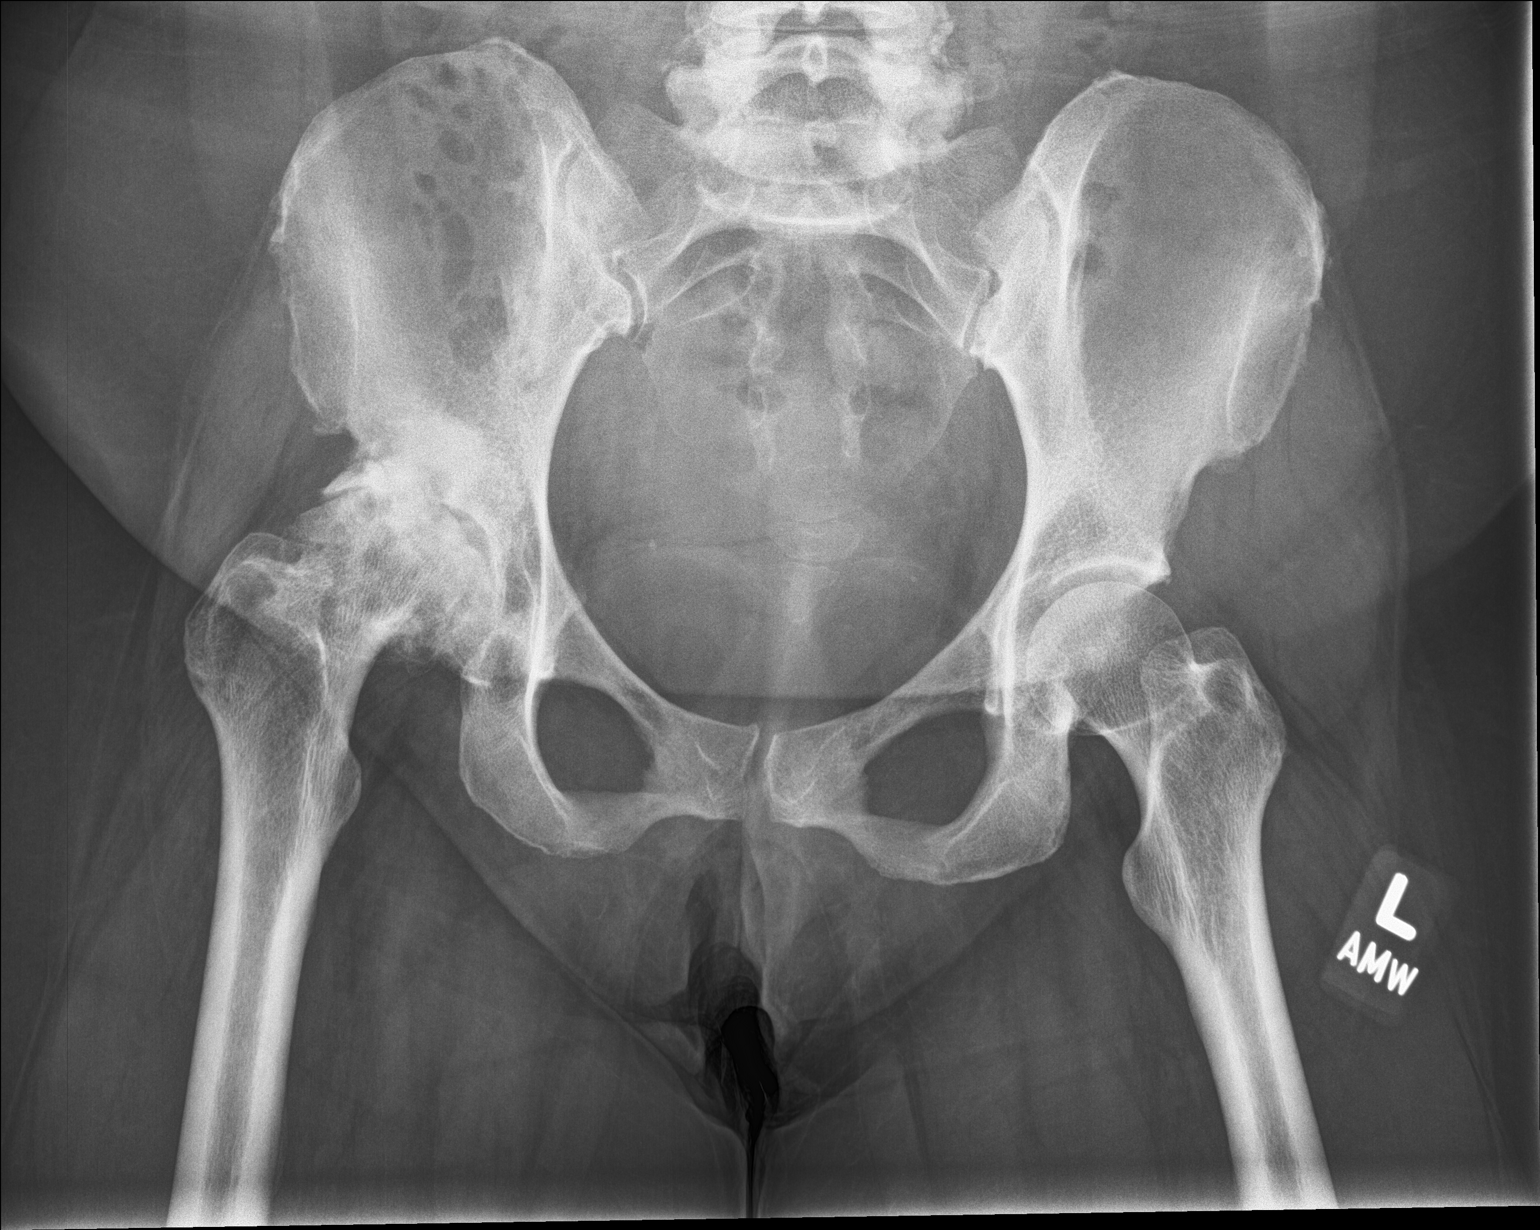

[hip ap]
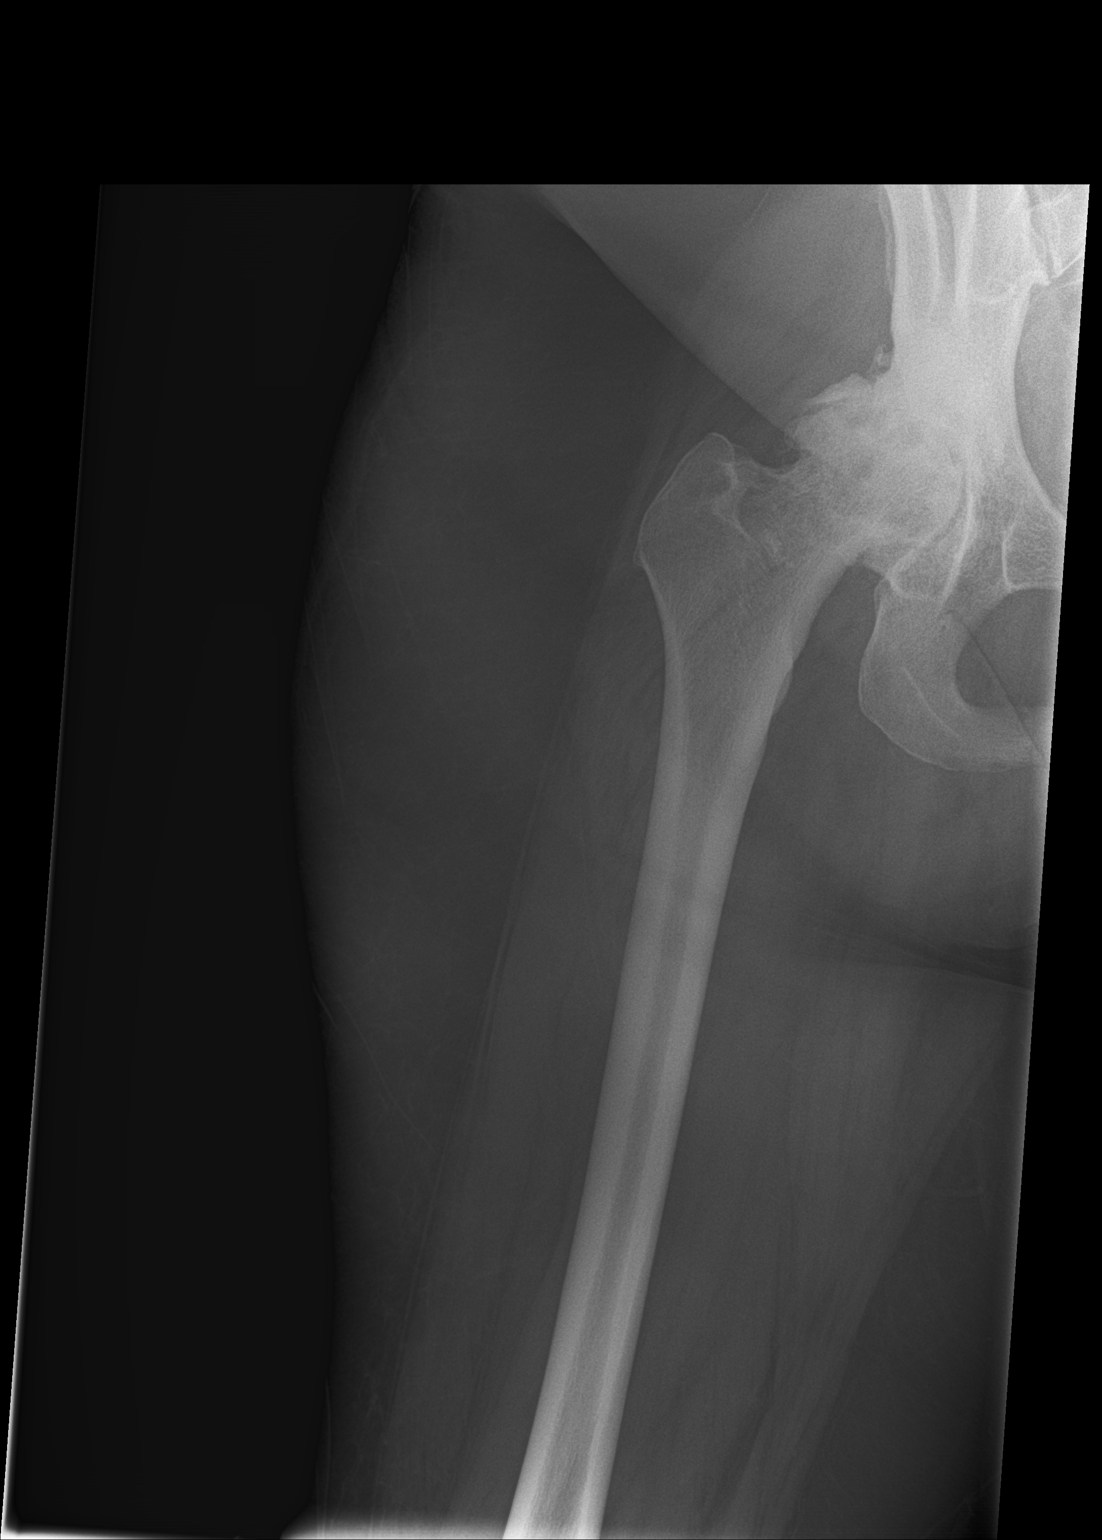

[hip lat]
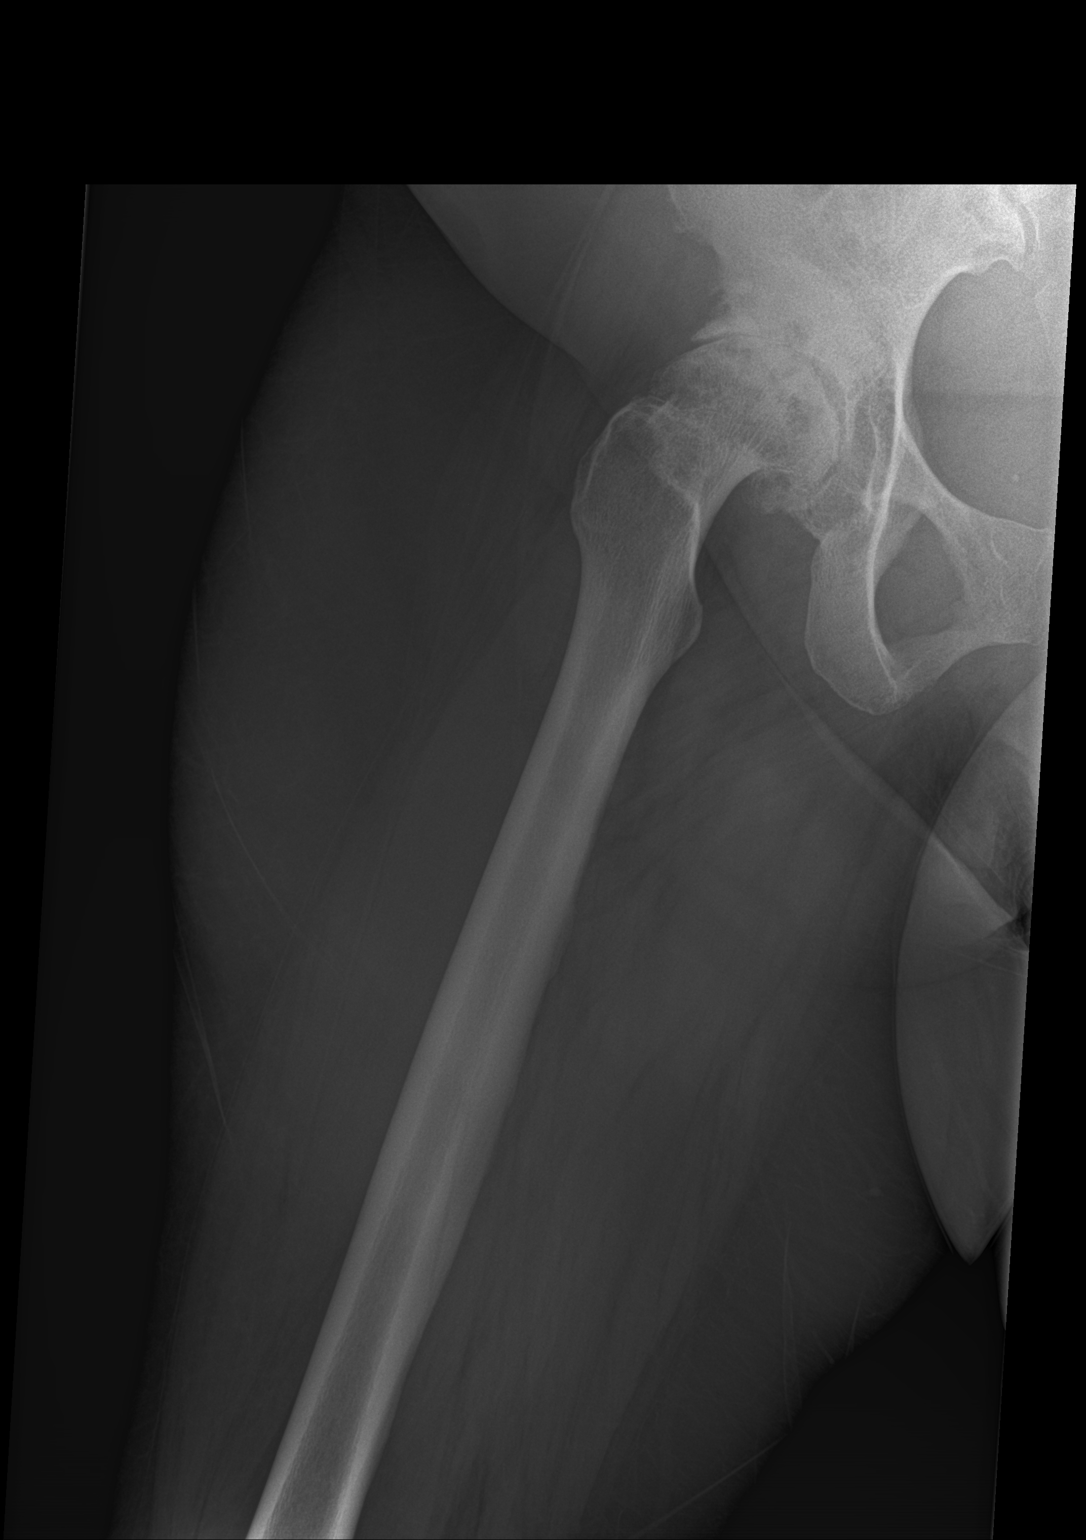

[3 of 3 positions shown; findings below may reference images not displayed]

FINDINGS: There is severe right hip osteoarthritis with femoroacetabular
remodeling, significant subchondral sclerosis and cystic change.
This has progressed since the prior exam in 3514. Probable element
of congenital hip dysplasia on the right. The left hip is
unremarkable on frontal view. There is no evidence of acute
fracture.
IMPRESSION: No evidence of acute fracture.

Severe right hip osteoarthritis with femoroacetabular remodeling,
progressed since the prior exam in [DATE].
